# Patient Record
Sex: Female | Born: 1955 | Race: White | Hispanic: No | Marital: Married | Smoking: Never smoker
Health system: Southern US, Community
[De-identification: ages and names within clinical notes are randomized; demographics above are authoritative.]

## PROBLEM LIST (undated history)

## (undated) DIAGNOSIS — Z972 Presence of dental prosthetic device (complete) (partial): Secondary | ICD-10-CM

## (undated) DIAGNOSIS — C801 Malignant (primary) neoplasm, unspecified: Secondary | ICD-10-CM

## (undated) DIAGNOSIS — E669 Obesity, unspecified: Secondary | ICD-10-CM

## (undated) DIAGNOSIS — Z853 Personal history of malignant neoplasm of breast: Secondary | ICD-10-CM

## (undated) DIAGNOSIS — R920 Mammographic microcalcification found on diagnostic imaging of breast: Secondary | ICD-10-CM

## (undated) DIAGNOSIS — Z8614 Personal history of Methicillin resistant Staphylococcus aureus infection: Secondary | ICD-10-CM

## (undated) DIAGNOSIS — H269 Unspecified cataract: Secondary | ICD-10-CM

## (undated) DIAGNOSIS — M5416 Radiculopathy, lumbar region: Secondary | ICD-10-CM

## (undated) DIAGNOSIS — Z98811 Dental restoration status: Secondary | ICD-10-CM

## (undated) DIAGNOSIS — M1990 Unspecified osteoarthritis, unspecified site: Secondary | ICD-10-CM

## (undated) HISTORY — PX: LYMPH NODE DISSECTION: SHX5087

## (undated) HISTORY — PX: OTHER SURGICAL HISTORY: SHX169

## (undated) HISTORY — DX: Radiculopathy, lumbar region: M54.16

## (undated) HISTORY — PX: DILATION AND CURETTAGE OF UTERUS: SHX78

## (undated) HISTORY — PX: TONSILLECTOMY: SUR1361

## (undated) HISTORY — DX: Personal history of Methicillin resistant Staphylococcus aureus infection: Z86.14

## (undated) HISTORY — PX: BREAST LUMPECTOMY: SHX2

## (undated) HISTORY — DX: Unspecified osteoarthritis, unspecified site: M19.90

## (undated) HISTORY — DX: Obesity, unspecified: E66.9

## (undated) HISTORY — PX: COLONOSCOPY: SHX174

## (undated) HISTORY — PX: KNEE ARTHROSCOPY: SHX127

## (undated) HISTORY — PX: TUBAL LIGATION: SHX77

## (undated) HISTORY — DX: Malignant (primary) neoplasm, unspecified: C80.1

---

## 1999-02-13 ENCOUNTER — Ambulatory Visit (HOSPITAL_BASED_OUTPATIENT_CLINIC_OR_DEPARTMENT_OTHER): Admission: RE | Admit: 1999-02-13 | Discharge: 1999-02-13 | Payer: Self-pay | Admitting: Orthopedic Surgery

## 1999-02-20 ENCOUNTER — Ambulatory Visit (HOSPITAL_BASED_OUTPATIENT_CLINIC_OR_DEPARTMENT_OTHER): Admission: RE | Admit: 1999-02-20 | Discharge: 1999-02-20 | Payer: Self-pay | Admitting: Orthopedic Surgery

## 1999-10-03 ENCOUNTER — Other Ambulatory Visit: Admission: RE | Admit: 1999-10-03 | Discharge: 1999-10-03 | Payer: Self-pay | Admitting: Obstetrics & Gynecology

## 2000-10-22 DIAGNOSIS — Z9889 Other specified postprocedural states: Secondary | ICD-10-CM

## 2000-10-22 HISTORY — DX: Other specified postprocedural states: Z98.890

## 2000-12-04 ENCOUNTER — Other Ambulatory Visit: Admission: RE | Admit: 2000-12-04 | Discharge: 2000-12-04 | Payer: Self-pay | Admitting: Obstetrics & Gynecology

## 2001-04-09 ENCOUNTER — Encounter (INDEPENDENT_AMBULATORY_CARE_PROVIDER_SITE_OTHER): Payer: Self-pay | Admitting: *Deleted

## 2001-04-09 ENCOUNTER — Encounter: Admission: RE | Admit: 2001-04-09 | Discharge: 2001-04-09 | Payer: Self-pay | Admitting: General Surgery

## 2001-04-09 ENCOUNTER — Other Ambulatory Visit: Admission: RE | Admit: 2001-04-09 | Discharge: 2001-04-09 | Payer: Self-pay | Admitting: General Surgery

## 2001-04-09 ENCOUNTER — Encounter: Payer: Self-pay | Admitting: General Surgery

## 2001-04-28 ENCOUNTER — Encounter (INDEPENDENT_AMBULATORY_CARE_PROVIDER_SITE_OTHER): Payer: Self-pay | Admitting: *Deleted

## 2001-04-28 ENCOUNTER — Encounter: Admission: RE | Admit: 2001-04-28 | Discharge: 2001-04-28 | Payer: Self-pay | Admitting: General Surgery

## 2001-04-28 ENCOUNTER — Encounter: Payer: Self-pay | Admitting: General Surgery

## 2001-04-28 ENCOUNTER — Ambulatory Visit (HOSPITAL_BASED_OUTPATIENT_CLINIC_OR_DEPARTMENT_OTHER): Admission: RE | Admit: 2001-04-28 | Discharge: 2001-04-28 | Payer: Self-pay | Admitting: General Surgery

## 2001-05-06 ENCOUNTER — Ambulatory Visit: Admission: RE | Admit: 2001-05-06 | Discharge: 2001-08-04 | Payer: Self-pay | Admitting: *Deleted

## 2001-08-05 ENCOUNTER — Ambulatory Visit: Admission: RE | Admit: 2001-08-05 | Discharge: 2001-11-03 | Payer: Self-pay | Admitting: *Deleted

## 2001-11-26 ENCOUNTER — Encounter: Admission: RE | Admit: 2001-11-26 | Discharge: 2001-11-26 | Payer: Self-pay | Admitting: General Surgery

## 2001-11-26 ENCOUNTER — Encounter: Payer: Self-pay | Admitting: General Surgery

## 2001-12-23 ENCOUNTER — Other Ambulatory Visit: Admission: RE | Admit: 2001-12-23 | Discharge: 2001-12-23 | Payer: Self-pay | Admitting: Obstetrics & Gynecology

## 2002-11-24 ENCOUNTER — Encounter: Payer: Self-pay | Admitting: General Surgery

## 2002-11-24 ENCOUNTER — Encounter: Admission: RE | Admit: 2002-11-24 | Discharge: 2002-11-24 | Payer: Self-pay | Admitting: General Surgery

## 2003-02-17 ENCOUNTER — Other Ambulatory Visit: Admission: RE | Admit: 2003-02-17 | Discharge: 2003-02-17 | Payer: Self-pay | Admitting: Obstetrics & Gynecology

## 2003-08-04 ENCOUNTER — Encounter (HOSPITAL_COMMUNITY): Admission: RE | Admit: 2003-08-04 | Discharge: 2003-11-02 | Payer: Self-pay | Admitting: General Surgery

## 2003-08-04 ENCOUNTER — Encounter: Payer: Self-pay | Admitting: General Surgery

## 2003-08-05 ENCOUNTER — Encounter: Payer: Self-pay | Admitting: General Surgery

## 2004-01-19 ENCOUNTER — Encounter: Admission: RE | Admit: 2004-01-19 | Discharge: 2004-01-19 | Payer: Self-pay | Admitting: Obstetrics & Gynecology

## 2004-01-24 ENCOUNTER — Encounter (INDEPENDENT_AMBULATORY_CARE_PROVIDER_SITE_OTHER): Payer: Self-pay | Admitting: *Deleted

## 2004-01-24 ENCOUNTER — Ambulatory Visit (HOSPITAL_COMMUNITY): Admission: RE | Admit: 2004-01-24 | Discharge: 2004-01-24 | Payer: Self-pay | Admitting: Orthopedic Surgery

## 2004-01-24 ENCOUNTER — Ambulatory Visit (HOSPITAL_BASED_OUTPATIENT_CLINIC_OR_DEPARTMENT_OTHER): Admission: RE | Admit: 2004-01-24 | Discharge: 2004-01-24 | Payer: Self-pay | Admitting: Orthopedic Surgery

## 2004-09-05 ENCOUNTER — Other Ambulatory Visit: Admission: RE | Admit: 2004-09-05 | Discharge: 2004-09-05 | Payer: Self-pay | Admitting: Obstetrics & Gynecology

## 2005-01-01 ENCOUNTER — Ambulatory Visit: Payer: Self-pay | Admitting: Oncology

## 2005-01-22 ENCOUNTER — Encounter: Admission: RE | Admit: 2005-01-22 | Discharge: 2005-01-22 | Payer: Self-pay | Admitting: Oncology

## 2005-02-22 ENCOUNTER — Ambulatory Visit: Payer: Self-pay | Admitting: Oncology

## 2005-05-01 ENCOUNTER — Ambulatory Visit: Payer: Self-pay | Admitting: Oncology

## 2005-08-27 ENCOUNTER — Other Ambulatory Visit: Admission: RE | Admit: 2005-08-27 | Discharge: 2005-08-27 | Payer: Self-pay | Admitting: Obstetrics & Gynecology

## 2006-02-01 ENCOUNTER — Encounter: Admission: RE | Admit: 2006-02-01 | Discharge: 2006-02-01 | Payer: Self-pay | Admitting: Obstetrics & Gynecology

## 2006-04-17 ENCOUNTER — Ambulatory Visit: Payer: Self-pay | Admitting: Oncology

## 2006-04-25 LAB — COMPREHENSIVE METABOLIC PANEL
AST: 13 U/L (ref 0–37)
Albumin: 4.4 g/dL (ref 3.5–5.2)
Alkaline Phosphatase: 43 U/L (ref 39–117)
BUN: 18 mg/dL (ref 6–23)
Potassium: 4.1 mEq/L (ref 3.5–5.3)
Sodium: 140 mEq/L (ref 135–145)

## 2006-04-25 LAB — CBC WITH DIFFERENTIAL/PLATELET
Eosinophils Absolute: 0.1 10*3/uL (ref 0.0–0.5)
MCH: 31.3 pg (ref 26.0–34.0)
MCHC: 33.4 g/dL (ref 32.0–36.0)
MCV: 93.7 fL (ref 81.0–101.0)
MONO#: 0.6 10*3/uL (ref 0.1–0.9)
MONO%: 8 % (ref 0.0–13.0)
NEUT%: 56.1 % (ref 39.6–76.8)
Platelets: 443 10*3/uL — ABNORMAL HIGH (ref 145–400)
RDW: 13.5 % (ref 11.3–14.5)
WBC: 7.3 10*3/uL (ref 3.9–10.0)

## 2006-04-25 LAB — MORPHOLOGY: RBC Comments: NORMAL

## 2007-02-10 ENCOUNTER — Encounter: Admission: RE | Admit: 2007-02-10 | Discharge: 2007-02-10 | Payer: Self-pay | Admitting: Oncology

## 2007-04-17 ENCOUNTER — Ambulatory Visit: Payer: Self-pay | Admitting: Oncology

## 2007-04-23 LAB — COMPREHENSIVE METABOLIC PANEL
AST: 13 U/L (ref 0–37)
Albumin: 4.1 g/dL (ref 3.5–5.2)
Alkaline Phosphatase: 50 U/L (ref 39–117)
BUN: 19 mg/dL (ref 6–23)
Glucose, Bld: 77 mg/dL (ref 70–99)
Potassium: 4.4 mEq/L (ref 3.5–5.3)
Sodium: 142 mEq/L (ref 135–145)
Total Bilirubin: 0.3 mg/dL (ref 0.3–1.2)
Total Protein: 6.9 g/dL (ref 6.0–8.3)

## 2007-04-23 LAB — CBC WITH DIFFERENTIAL/PLATELET
Basophils Absolute: 0 10*3/uL (ref 0.0–0.1)
Eosinophils Absolute: 0.2 10*3/uL (ref 0.0–0.5)
HCT: 39 % (ref 34.8–46.6)
LYMPH%: 34.1 % (ref 14.0–48.0)
MCV: 92.6 fL (ref 81.0–101.0)
MONO%: 10.4 % (ref 0.0–13.0)
NEUT#: 3.6 10*3/uL (ref 1.5–6.5)
NEUT%: 52.9 % (ref 39.6–76.8)
Platelets: 471 10*3/uL — ABNORMAL HIGH (ref 145–400)
RBC: 4.22 10*6/uL (ref 3.70–5.32)

## 2008-02-26 ENCOUNTER — Encounter: Admission: RE | Admit: 2008-02-26 | Discharge: 2008-02-26 | Payer: Self-pay | Admitting: Oncology

## 2008-04-21 ENCOUNTER — Ambulatory Visit: Payer: Self-pay | Admitting: Oncology

## 2008-04-26 LAB — CBC WITH DIFFERENTIAL/PLATELET
Basophils Absolute: 0 10*3/uL (ref 0.0–0.1)
EOS%: 2.4 % (ref 0.0–7.0)
HCT: 39.6 % (ref 34.8–46.6)
LYMPH%: 33.2 % (ref 14.0–48.0)
MCH: 30.9 pg (ref 26.0–34.0)
MCHC: 33.9 g/dL (ref 32.0–36.0)
MCV: 91 fL (ref 81.0–101.0)
MONO#: 0.6 10*3/uL (ref 0.1–0.9)
NEUT#: 3.2 10*3/uL (ref 1.5–6.5)
Platelets: 460 10*3/uL — ABNORMAL HIGH (ref 145–400)
RBC: 4.35 10*6/uL (ref 3.70–5.32)
RDW: 13 % (ref 11.3–14.5)
WBC: 5.9 10*3/uL (ref 3.9–10.0)

## 2008-04-26 LAB — COMPREHENSIVE METABOLIC PANEL
ALT: 11 U/L (ref 0–35)
Albumin: 4.3 g/dL (ref 3.5–5.2)
Alkaline Phosphatase: 51 U/L (ref 39–117)
BUN: 13 mg/dL (ref 6–23)
CO2: 24 mEq/L (ref 19–32)
Chloride: 105 mEq/L (ref 96–112)
Creatinine, Ser: 0.72 mg/dL (ref 0.40–1.20)
Glucose, Bld: 88 mg/dL (ref 70–99)
Total Bilirubin: 0.5 mg/dL (ref 0.3–1.2)

## 2008-04-26 LAB — LACTATE DEHYDROGENASE: LDH: 179 U/L (ref 94–250)

## 2008-04-26 LAB — MORPHOLOGY

## 2008-05-27 ENCOUNTER — Ambulatory Visit (HOSPITAL_COMMUNITY): Admission: RE | Admit: 2008-05-27 | Discharge: 2008-05-28 | Payer: Self-pay | Admitting: Orthopedic Surgery

## 2008-05-27 HISTORY — PX: CARPAL TUNNEL RELEASE: SHX101

## 2008-05-27 HISTORY — PX: SHOULDER ARTHROSCOPY: SHX128

## 2008-07-07 ENCOUNTER — Ambulatory Visit: Payer: Self-pay | Admitting: Oncology

## 2009-04-06 ENCOUNTER — Encounter: Admission: RE | Admit: 2009-04-06 | Discharge: 2009-04-06 | Payer: Self-pay | Admitting: Obstetrics & Gynecology

## 2009-04-20 ENCOUNTER — Ambulatory Visit: Payer: Self-pay | Admitting: Oncology

## 2009-05-06 LAB — CBC & DIFF AND RETIC
BASO%: 0.3 % (ref 0.0–2.0)
Basophils Absolute: 0 10*3/uL (ref 0.0–0.1)
Eosinophils Absolute: 0.2 10*3/uL (ref 0.0–0.5)
HGB: 13.5 g/dL (ref 11.6–15.9)
Immature Retic Fract: 3 % (ref 0.00–10.70)
LYMPH%: 34.6 % (ref 14.0–49.7)
MCH: 30.8 pg (ref 25.1–34.0)
MCHC: 34.3 g/dL (ref 31.5–36.0)
MCV: 89.7 fL (ref 79.5–101.0)
NEUT%: 52.9 % (ref 38.4–76.8)
RBC: 4.39 10*6/uL (ref 3.70–5.45)
RDW: 13.1 % (ref 11.2–14.5)
Retic %: 0.67 % (ref 0.50–1.50)
WBC: 6.6 10*3/uL (ref 3.9–10.3)
lymph#: 2.3 10*3/uL (ref 0.9–3.3)
nRBC: 0 % (ref 0–0)

## 2009-05-06 LAB — COMPREHENSIVE METABOLIC PANEL
Albumin: 4.1 g/dL (ref 3.5–5.2)
Calcium: 9.3 mg/dL (ref 8.4–10.5)
Chloride: 107 mEq/L (ref 96–112)
Potassium: 4.4 mEq/L (ref 3.5–5.3)
Sodium: 142 mEq/L (ref 135–145)
Total Protein: 6.7 g/dL (ref 6.0–8.3)

## 2009-05-06 LAB — IRON AND TIBC: Iron: 93 ug/dL (ref 42–145)

## 2009-05-06 LAB — FERRITIN: Ferritin: 51 ng/mL (ref 10–291)

## 2009-05-06 LAB — LACTATE DEHYDROGENASE: LDH: 166 U/L (ref 94–250)

## 2010-04-17 ENCOUNTER — Encounter: Admission: RE | Admit: 2010-04-17 | Discharge: 2010-04-17 | Payer: Self-pay | Admitting: Obstetrics & Gynecology

## 2010-04-26 ENCOUNTER — Ambulatory Visit: Payer: Self-pay | Admitting: Oncology

## 2010-04-28 LAB — MORPHOLOGY: PLT EST: ADEQUATE

## 2010-04-28 LAB — CBC WITH DIFFERENTIAL/PLATELET
EOS%: 3.2 % (ref 0.0–7.0)
HCT: 38.5 % (ref 34.8–46.6)
LYMPH%: 37.2 % (ref 14.0–49.7)
MCH: 30.2 pg (ref 25.1–34.0)
MCHC: 33 g/dL (ref 31.5–36.0)
MONO%: 8 % (ref 0.0–14.0)
NEUT%: 51 % (ref 38.4–76.8)
Platelets: 380 10*3/uL (ref 145–400)
RDW: 13.5 % (ref 11.2–14.5)
WBC: 6.9 10*3/uL (ref 3.9–10.3)
lymph#: 2.6 10*3/uL (ref 0.9–3.3)
nRBC: 0 % (ref 0–0)

## 2010-04-28 LAB — COMPREHENSIVE METABOLIC PANEL
AST: 14 U/L (ref 0–37)
Albumin: 3.9 g/dL (ref 3.5–5.2)
Alkaline Phosphatase: 53 U/L (ref 39–117)
Calcium: 9 mg/dL (ref 8.4–10.5)
Creatinine, Ser: 0.88 mg/dL (ref 0.40–1.20)
Sodium: 142 mEq/L (ref 135–145)
Total Protein: 6.7 g/dL (ref 6.0–8.3)

## 2010-04-28 LAB — LACTATE DEHYDROGENASE: LDH: 166 U/L (ref 94–250)

## 2010-10-27 ENCOUNTER — Encounter (INDEPENDENT_AMBULATORY_CARE_PROVIDER_SITE_OTHER): Payer: Self-pay | Admitting: *Deleted

## 2010-11-11 ENCOUNTER — Encounter: Payer: Self-pay | Admitting: Oncology

## 2010-11-11 ENCOUNTER — Other Ambulatory Visit: Payer: Self-pay | Admitting: Oncology

## 2010-11-11 DIAGNOSIS — Z1239 Encounter for other screening for malignant neoplasm of breast: Secondary | ICD-10-CM

## 2010-11-12 ENCOUNTER — Encounter: Payer: Self-pay | Admitting: General Surgery

## 2010-11-12 ENCOUNTER — Encounter: Payer: Self-pay | Admitting: Oncology

## 2010-11-13 ENCOUNTER — Encounter: Payer: Self-pay | Admitting: Oncology

## 2010-11-23 NOTE — Letter (Signed)
Summary: Pre Visit Letter Revised  [facility]  [address] [place], [place] [address]   Phone: [phone]  Fax: [phone]        10/27/2010 MRN: [id number] [facility] [address] [place], [place]  [address]             Procedure Date:  12/12/2010 @ 10:30   Recall Colon -Dr. Nestor Ramp   Welcome to the Gastroenterology Division at [facility].    You are scheduled to see a nurse for your pre-procedure visit on 11/28/2010 at 3:30 on the 3rd floor at [facility], [address]  We ask that you try to arrive at our office 15 minutes prior to your appointment time to allow for check-in.  Please take a minute to review the attached form.  If you answer "Yes" to one or more of the questions on the first page, we ask that you call the person listed at your earliest opportunity.  If you answer "No" to all of the questions, please complete the rest of the form and bring it to your appointment.    Your nurse visit will consist of discussing your medical and surgical history, your immediate family medical history, and your medications.   If you are unable to list all of your medications on the form, please bring the medication bottles to your appointment and we will list them.  We will need to be aware of both prescribed and over the counter drugs.  We will need to know exact dosage information as well.    Please be prepared to read and sign documents such as consent forms, a financial agreement, and acknowledgement forms.  If necessary, and with your consent, a friend or relative is welcome to sit-in on the nurse visit with you.  Please bring your insurance card so that we may make a copy of it.  If your insurance requires a referral to see a specialist, please bring your referral form from your primary care physician.  No co-pay is required for this nurse visit.     If you cannot keep your appointment, please call [phone] to cancel or reschedule prior to your  appointment date.  This allows [place] the opportunity to schedule an appointment for another patient in need of care.    Thank you for choosing [facility] for your medical needs.  We appreciate the opportunity to care for you.  Please visit [place] at our website  to learn more about our practice.  Sincerely, The Gastroenterology Division

## 2010-11-28 ENCOUNTER — Encounter (INDEPENDENT_AMBULATORY_CARE_PROVIDER_SITE_OTHER): Payer: Self-pay

## 2010-12-13 NOTE — Letter (Signed)
Summary: Pre Visit No Show Letter  [facility]  [address] [place], [place] [address]   Phone: [phone]  Fax: [phone]        November 28, 2010 MRN: [id number]    [facility] [address] [place], [place]  [address]    Dear [name],   We have been unable to reach you by phone concerning the pre-procedure visit that you missed on 11/28/10. For this reason,your procedure scheduled on 12/14/10 has been cancelled. Our scheduling staff will gladly assist you with rescheduling your appointments at a more convenient time. Please call our office at [phone] between the hours of 8:00am and 5:00pm, press option #2 to reach an appointment scheduler. Please consider updating your contact numbers at this time so that we can reach you by phone in the future with schedule changes or results.    Thank you,    Ulis Rias RN [facility]

## 2010-12-14 ENCOUNTER — Other Ambulatory Visit: Payer: Self-pay | Admitting: Internal Medicine

## 2011-03-06 NOTE — Op Note (Signed)
Alyssa [name], Alyssa [name]                 ACCOUNT NO.:  [id number]   MEDICAL RECORD NO.:  [id number]          PATIENT TYPE:  AMB   LOCATION:  DAY                          FACILITY:  [facility]   PHYSICIAN:  Marlowe Kays, M.D.  DATE OF BIRTH:  03/17/1956   DATE OF PROCEDURE:  05/27/2008  DATE OF DISCHARGE:                               OPERATIVE REPORT   PREOPERATIVE DIAGNOSES:  1. 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  2. Carpal tunnel syndrome right upper extremity.   POSTOPERATIVE DIAGNOSES:  1. 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  2. Carpal tunnel syndrome right upper extremity.   OPERATION:  1. Right shoulder arthroscopy with normal glenohumeral examination.  2. Arthroscopic subacromial and distal clavicle decompression.  3. Decompression right wrist and hand.   SURGEON:  Marlowe Kays, M.D.   ASSISTANTDruscilla Brownie. Idolina Primer, P.A.-C., operations #1 and 2.   ANESTHESIA:  General.   PATHOLOGY/JUSTIFICATION FOR PROCEDURE:  She had an MRI demonstrating  rotator cuff tendinopathy with some osteoarthritis of the AC joint.  Clinically, she had no tenderness there.  She also had a mild carpal  tunnel syndrome confirmed with nerve conduction studies.  She elected to  have the carpal tunnel release at this time since she was already having  shoulder surgery and the natural history that the carpal tunnel syndrome  would probably progress with time.   PROCEDURE:  Satisfactory general anesthesia.  Placed on the [place] frame,  and right shoulder girdle was prepped with DuraPrep, draped in sterile  field.  The anatomy of the shoulder joint was marked out.  Time out  performed.  I infiltrated the subacromial space and placement of the  posterior and lateral portals with half percent Marcaine with  adrenaline.  Through a posterior soft spot portal, I atraumatically  entered the glenohumeral joint.  There was minimal fraying of the  labrum, and  basically, she had a normal glenohumeral examination.  Representative pictures were taken.  I then redirected the scope in the  subacromial area.  She had a good bit of bursitis which was pictured.  I  debrided out a good bit of the subacromial space with the 4.2 shaver and  followed this with the 90-degree ArthroCare vaporizer removing soft  tissue from the undersurface of the acromion.  I also found that she had  prominent spurring at the [facility] joint, and I debrided this as well.  I then  followed this with 4-mm oval bur and began burring down not only the [facility]  joint but the undersurface of the distal acromion, went back-and-forth  between these 3 instruments until we had a nice clean subacromial joint  with wide decompression.  Both the [facility] joint and the subacromial space  documented with pictures using the vaporizer with the arm to the side  and the arm abducted.  I then irrigated the shoulder thoroughly and  closed the 2 portals with 4-0 nylon.  We infiltrated with half percent  Marcaine with adrenaline as well as subacromial space.  Betadine,  Adaptic, dry sterile dressing were applied.  I then  applied a pneumatic  tourniquet to the right arm and esmarched out the arm nonsterilely with  a pressure of 250 mmHg.  The right arm was then prepped from mid forearm  to fingertips with DuraPrep, draped in sterile field.  A second time-out  was performed.  I marked out a curved incision along the base of thenar  eminence crossing obliquely over the flexor crease of the wrist into the  distal forearm.  Palmaris longus tendon was identified and retracted to  the radial side.  The median nerve was identified, and I then began  progressive release of skin and subcutaneous tissue and the fascia into  the distal palm.  The main area of compression was in the mid palm, but  there was compression distally.  Potential bleeders were coagulated with  bipolar cautery.  The wound was then irrigated well with  sterile saline,  and the skin and subcutaneous tissue only closed with interrupted 4-0  nylon mattress sutures.  Betadine, dry sterile dressing, volar plaster  splint were applied.  Tourniquet was released.  She was placed in a  shoulder immobilizer for her shoulder and taken to the PACU in  satisfactory condition with no known complications.           ______________________________  Marlowe Kays, M.D.     JA/MEDQ  D:  05/27/2008  T:  05/27/2008  Job:  [id number]

## 2011-04-20 ENCOUNTER — Ambulatory Visit
Admission: RE | Admit: 2011-04-20 | Discharge: 2011-04-20 | Disposition: A | Discharge status: Home / Self Care | Payer: Private Health Insurance - Indemnity | Source: Ambulatory Visit | Attending: Oncology | Admitting: Oncology

## 2011-04-20 DIAGNOSIS — Z1239 Encounter for other screening for malignant neoplasm of breast: Secondary | ICD-10-CM

## 2011-06-01 ENCOUNTER — Other Ambulatory Visit: Payer: Self-pay | Admitting: Oncology

## 2011-06-01 ENCOUNTER — Encounter: Payer: Private Health Insurance - Indemnity | Admitting: Oncology

## 2011-06-01 LAB — CBC WITH DIFFERENTIAL/PLATELET
BASO%: 0.6 % (ref 0.0–2.0)
EOS%: 1.7 % (ref 0.0–7.0)
HCT: 39.8 % (ref 34.8–46.6)
HGB: 13.4 g/dL (ref 11.6–15.9)
MCHC: 33.7 g/dL (ref 31.5–36.0)
MCV: 93.7 fL (ref 79.5–101.0)
MONO%: 8.5 % (ref 0.0–14.0)
NEUT%: 51.2 % (ref 38.4–76.8)
Platelets: 379 10*3/uL (ref 145–400)
RDW: 13.7 % (ref 11.2–14.5)
WBC: 6.4 10*3/uL (ref 3.9–10.3)
lymph#: 2.4 10*3/uL (ref 0.9–3.3)

## 2011-06-01 LAB — COMPREHENSIVE METABOLIC PANEL
Alkaline Phosphatase: 53 U/L (ref 39–117)
CO2: 26 mEq/L (ref 19–32)
Chloride: 107 mEq/L (ref 96–112)
Glucose, Bld: 79 mg/dL (ref 70–99)
Sodium: 141 mEq/L (ref 135–145)
Total Bilirubin: 0.3 mg/dL (ref 0.3–1.2)

## 2011-06-01 LAB — LACTATE DEHYDROGENASE: LDH: 180 U/L (ref 94–250)

## 2011-07-20 LAB — HEMOGLOBIN AND HEMATOCRIT, BLOOD
HCT: 40.1
Hemoglobin: 13.4

## 2011-10-23 DIAGNOSIS — K5790 Diverticulosis of intestine, part unspecified, without perforation or abscess without bleeding: Secondary | ICD-10-CM

## 2011-10-23 HISTORY — DX: Diverticulosis of intestine, part unspecified, without perforation or abscess without bleeding: K57.90

## 2011-11-22 ENCOUNTER — Ambulatory Visit
Admission: RE | Admit: 2011-11-22 | Discharge: 2011-11-22 | Disposition: A | Discharge status: Home / Self Care | Payer: Private Health Insurance - Indemnity | Source: Ambulatory Visit | Attending: Family Medicine | Admitting: Family Medicine

## 2011-11-22 ENCOUNTER — Other Ambulatory Visit: Payer: Self-pay | Admitting: Family Medicine

## 2011-11-22 DIAGNOSIS — R509 Fever, unspecified: Secondary | ICD-10-CM

## 2012-01-21 ENCOUNTER — Encounter: Payer: Self-pay | Admitting: Internal Medicine

## 2012-03-11 ENCOUNTER — Ambulatory Visit (AMBULATORY_SURGERY_CENTER): Payer: Private Health Insurance - Indemnity

## 2012-03-11 VITALS — Ht 65.0 in | Wt 194.9 lb

## 2012-03-11 DIAGNOSIS — K5790 Diverticulosis of intestine, part unspecified, without perforation or abscess without bleeding: Secondary | ICD-10-CM

## 2012-03-11 DIAGNOSIS — K5730 Diverticulosis of large intestine without perforation or abscess without bleeding: Secondary | ICD-10-CM

## 2012-03-11 DIAGNOSIS — Z1211 Encounter for screening for malignant neoplasm of colon: Secondary | ICD-10-CM

## 2012-03-11 MED ORDER — PEG-KCL-NACL-NASULF-NA ASC-C 100 G PO SOLR: 1.0000 | Freq: Once | ORAL | Status: AC

## 2012-03-25 ENCOUNTER — Ambulatory Visit: Payer: Private Health Insurance - Indemnity | Admitting: Internal Medicine

## 2012-04-28 ENCOUNTER — Telehealth: Payer: Self-pay | Admitting: Internal Medicine

## 2012-04-28 NOTE — Telephone Encounter (Signed)
Called pt to review prep instructions.  No answer.  Will try later

## 2012-04-28 NOTE — Telephone Encounter (Signed)
Pt came in office today and picked up updated prep instructions.

## 2012-05-01 ENCOUNTER — Ambulatory Visit (AMBULATORY_SURGERY_CENTER): Payer: Private Health Insurance - Indemnity | Admitting: Internal Medicine

## 2012-05-01 ENCOUNTER — Encounter: Payer: Self-pay | Admitting: Internal Medicine

## 2012-05-01 VITALS — BP 143/95 | HR 64 | Temp 98.2°F | Resp 16 | Ht 65.0 in | Wt 194.0 lb

## 2012-05-01 DIAGNOSIS — Z1211 Encounter for screening for malignant neoplasm of colon: Secondary | ICD-10-CM

## 2012-05-01 DIAGNOSIS — K5730 Diverticulosis of large intestine without perforation or abscess without bleeding: Secondary | ICD-10-CM

## 2012-05-01 HISTORY — PX: COLONOSCOPY WITH PROPOFOL: SHX5780

## 2012-05-01 MED ORDER — SODIUM CHLORIDE 0.9 % IV SOLN: 500.0000 mL | INTRAVENOUS | Status: DC

## 2012-05-01 NOTE — Op Note (Signed)
 [facility] [address] [place], [place]  [address]  COLONOSCOPY PROCEDURE REPORT  PATIENT:  [name], Alyssa  MR#:  [id number] BIRTHDATE:  1956/04/09, 55 yrs. old  GENDER:  female ENDOSCOPIST:  Wilhemina Bonito. Eda Keys, MD REF. BY:  Ancil Boozer, M.D. PROCEDURE DATE:  05/01/2012 PROCEDURE:  Average-risk screening colonoscopy G0121 ASA CLASS:  Class II INDICATIONS:  Routine risk Screening ; negative index exam 07-2001  MEDICATIONS:   MAC sedation, administered by CRNA, propofol (Diprivan) 350 mg IV  DESCRIPTION OF PROCEDURE:   After the risks benefits and alternatives of the procedure were thoroughly explained, informed consent was obtained.  Digital rectal exam was performed and revealed no abnormalities.   The LB CF-[id number] endoscope was introduced through the anus and advanced to the cecum, which was identified by both the appendix and ileocecal valve, without limitations.  The quality of the prep was good, using MoviPrep. The instrument was then slowly withdrawn as the colon was fully examined. <<PROCEDUREIMAGES>>  FINDINGS:  Severe diverticulosis was found in the left colon and ascending colon  Otherwise normal colonoscopy without  polyps, masses, vascular ectasias, or inflammatory changes.   Retroflexed views in the rectum revealed no abnormalities.    The time to cecum =  3:29  minutes. The scope was then withdrawn in 10:26 minutes from the cecum and the procedure completed.  COMPLICATIONS:  None  ENDOSCOPIC IMPRESSION: 1) Severe diverticulosis in the left colon and ascending colon 2) Otherwise normal colonoscopy  RECOMMENDATIONS: 1) Continue current colorectal screening recommendations for "routine risk" patients with a repeat colonoscopy in 10 years.  ______________________________ Wilhemina Bonito. Eda Keys, MD  CC:  Ancil Boozer MD;  The Patient  n. eSIGNED:   Wilhemina Bonito. Eda Keys at 05/01/2012 02:27 PM  Mammie Lorenzo, [phone]

## 2012-05-01 NOTE — Patient Instructions (Signed)

## 2012-05-01 NOTE — Progress Notes (Signed)
Patient did not experience any of the following events: a burn prior to discharge; a fall within the facility; wrong site/side/patient/procedure/implant event; or a hospital transfer or hospital admission upon discharge from the facility. (G8907) Patient did not have preoperative order for IV antibiotic SSI prophylaxis. (G8918)  

## 2012-05-02 ENCOUNTER — Telehealth: Payer: Self-pay | Admitting: *Deleted

## 2012-05-02 NOTE — Telephone Encounter (Signed)
Left message to call office if questions or concerns. 

## 2012-05-23 ENCOUNTER — Encounter: Payer: Private Health Insurance - Indemnity | Admitting: Gastroenterology

## 2014-07-14 ENCOUNTER — Other Ambulatory Visit: Payer: Self-pay | Admitting: Obstetrics & Gynecology

## 2014-07-15 LAB — CYTOLOGY - PAP

## 2014-07-16 ENCOUNTER — Other Ambulatory Visit: Payer: Self-pay | Admitting: Obstetrics & Gynecology

## 2014-07-16 DIAGNOSIS — R928 Other abnormal and inconclusive findings on diagnostic imaging of breast: Secondary | ICD-10-CM

## 2014-07-26 ENCOUNTER — Ambulatory Visit
Admission: RE | Admit: 2014-07-26 | Discharge: 2014-07-26 | Disposition: A | Discharge status: Home / Self Care | Payer: BC Managed Care – PPO | Source: Ambulatory Visit | Attending: Obstetrics & Gynecology | Admitting: Obstetrics & Gynecology

## 2014-07-26 ENCOUNTER — Encounter (INDEPENDENT_AMBULATORY_CARE_PROVIDER_SITE_OTHER): Payer: Self-pay

## 2014-07-26 DIAGNOSIS — R928 Other abnormal and inconclusive findings on diagnostic imaging of breast: Secondary | ICD-10-CM

## 2014-08-13 ENCOUNTER — Telehealth (INDEPENDENT_AMBULATORY_CARE_PROVIDER_SITE_OTHER): Payer: Self-pay

## 2014-08-13 NOTE — Telephone Encounter (Signed)
Pt made aware that her records and films have been sent to Dr. Andi Devon at [facility] for review.  Will contact the patient when we hear from them.

## 2014-08-31 ENCOUNTER — Telehealth (INDEPENDENT_AMBULATORY_CARE_PROVIDER_SITE_OTHER): Payer: Self-pay

## 2014-08-31 NOTE — Telephone Encounter (Signed)
Pt was seen for a nipple anomaly by Dr. Zella Richer.  Pt has cancelled an appt at [facility] for a second opinion, and would like to discuss what exactly would be involved with this surgery.  Pt would like to speak with Dr. Zella Richer.  Message also in [employer].

## 2014-09-03 ENCOUNTER — Encounter (INDEPENDENT_AMBULATORY_CARE_PROVIDER_SITE_OTHER): Payer: Self-pay | Admitting: General Surgery

## 2014-09-03 NOTE — Progress Notes (Signed)
Patient ID: Alyssa [name], female   DOB: 1956/04/10, 58 y.o.   MRN: [id number] I spoke with her today. She canceled her referral for a second opinion. She still undecided about what to do. She is also concerned about the financial burden may put on her and her family. I told her that I highly recommend she proceed with a second opinion as that may help her make her mind up. She stated she would think about it and speak with her husband about it. I asked her to get back with me with her plan of action.

## 2014-09-24 ENCOUNTER — Encounter (INDEPENDENT_AMBULATORY_CARE_PROVIDER_SITE_OTHER): Payer: Self-pay | Admitting: General Surgery

## 2014-09-24 NOTE — Progress Notes (Signed)
Patient ID: Alyssa [name], female   DOB: 1956/08/07, 58 y.o.   MRN: [id number] Dr. Barry Dienes reviewed her mammograms and suggested that a nipple punch biopsy could be attempted in lieu of nipple removal.  I explained this to [name].  She has decided to proceed with nipple removal and we will get this scheduled.

## 2014-10-22 DIAGNOSIS — R920 Mammographic microcalcification found on diagnostic imaging of breast: Secondary | ICD-10-CM

## 2014-10-22 HISTORY — DX: Mammographic microcalcification found on diagnostic imaging of breast: R92.0

## 2014-11-15 ENCOUNTER — Encounter (HOSPITAL_BASED_OUTPATIENT_CLINIC_OR_DEPARTMENT_OTHER): Payer: Self-pay | Admitting: *Deleted

## 2014-11-15 NOTE — Pre-Procedure Instructions (Signed)
To come for CBC, diff, CMET, PT(INR)

## 2014-11-16 ENCOUNTER — Encounter (HOSPITAL_BASED_OUTPATIENT_CLINIC_OR_DEPARTMENT_OTHER)
Admission: RE | Admit: 2014-11-16 | Discharge: 2014-11-16 | Disposition: A | Discharge status: Home / Self Care | Payer: 59 | Source: Ambulatory Visit | Attending: General Surgery | Admitting: General Surgery

## 2014-11-16 DIAGNOSIS — R920 Mammographic microcalcification found on diagnostic imaging of breast: Secondary | ICD-10-CM | POA: Diagnosis present

## 2014-11-16 DIAGNOSIS — Z7982 Long term (current) use of aspirin: Secondary | ICD-10-CM | POA: Diagnosis not present

## 2014-11-16 DIAGNOSIS — Z853 Personal history of malignant neoplasm of breast: Secondary | ICD-10-CM | POA: Diagnosis not present

## 2014-11-16 LAB — CBC WITH DIFFERENTIAL/PLATELET
BASOS ABS: 0 10*3/uL (ref 0.0–0.1)
Basophils Relative: 1 % (ref 0–1)
Eosinophils Absolute: 0 10*3/uL (ref 0.0–0.7)
Eosinophils Relative: 0 % (ref 0–5)
HEMATOCRIT: 41.4 % (ref 36.0–46.0)
Hemoglobin: 13.5 g/dL (ref 12.0–15.0)
LYMPHS ABS: 2.1 10*3/uL (ref 0.7–4.0)
Lymphocytes Relative: 39 % (ref 12–46)
MCH: 29.6 pg (ref 26.0–34.0)
MCHC: 32.6 g/dL (ref 30.0–36.0)
MCV: 90.8 fL (ref 78.0–100.0)
Monocytes Absolute: 0.5 10*3/uL (ref 0.1–1.0)
Monocytes Relative: 10 % (ref 3–12)
NEUTROS ABS: 2.7 10*3/uL (ref 1.7–7.7)
Neutrophils Relative %: 50 % (ref 43–77)
Platelets: 372 10*3/uL (ref 150–400)
RBC: 4.56 MIL/uL (ref 3.87–5.11)
RDW: 13.6 % (ref 11.5–15.5)
WBC: 5.3 10*3/uL (ref 4.0–10.5)

## 2014-11-16 LAB — COMPREHENSIVE METABOLIC PANEL
ALBUMIN: 3.8 g/dL (ref 3.5–5.2)
ALT: 18 U/L (ref 0–35)
ANION GAP: 8 (ref 5–15)
AST: 20 U/L (ref 0–37)
Alkaline Phosphatase: 58 U/L (ref 39–117)
BUN: 14 mg/dL (ref 6–23)
CHLORIDE: 108 mmol/L (ref 96–112)
CO2: 25 mmol/L (ref 19–32)
CREATININE: 0.83 mg/dL (ref 0.50–1.10)
Calcium: 9.1 mg/dL (ref 8.4–10.5)
GFR calc Af Amer: 88 mL/min — ABNORMAL LOW (ref >90)
GFR, EST NON AFRICAN AMERICAN: 76 mL/min — AB (ref >90)
GLUCOSE: 73 mg/dL (ref 70–99)
Potassium: 4.7 mmol/L (ref 3.5–5.1)
Sodium: 141 mmol/L (ref 135–145)
TOTAL PROTEIN: 7 g/dL (ref 6.0–8.3)
Total Bilirubin: 0.6 mg/dL (ref 0.3–1.2)

## 2014-11-16 LAB — PROTIME-INR
INR: 0.98 (ref 0.00–1.49)
PROTHROMBIN TIME: 13.1 s (ref 11.6–15.2)

## 2014-11-18 ENCOUNTER — Encounter (HOSPITAL_BASED_OUTPATIENT_CLINIC_OR_DEPARTMENT_OTHER)
Admission: RE | Disposition: A | Discharge status: Home / Self Care | Payer: Self-pay | Source: Ambulatory Visit | Attending: General Surgery

## 2014-11-18 ENCOUNTER — Encounter (HOSPITAL_BASED_OUTPATIENT_CLINIC_OR_DEPARTMENT_OTHER): Payer: Self-pay

## 2014-11-18 ENCOUNTER — Ambulatory Visit (HOSPITAL_BASED_OUTPATIENT_CLINIC_OR_DEPARTMENT_OTHER)
Admission: RE | Admit: 2014-11-18 | Discharge: 2014-11-18 | Disposition: A | Discharge status: Home / Self Care | Payer: 59 | Source: Ambulatory Visit | Attending: General Surgery | Admitting: General Surgery

## 2014-11-18 ENCOUNTER — Ambulatory Visit (HOSPITAL_BASED_OUTPATIENT_CLINIC_OR_DEPARTMENT_OTHER): Payer: 59 | Admitting: Anesthesiology

## 2014-11-18 DIAGNOSIS — R920 Mammographic microcalcification found on diagnostic imaging of breast: Secondary | ICD-10-CM | POA: Diagnosis not present

## 2014-11-18 DIAGNOSIS — Z7982 Long term (current) use of aspirin: Secondary | ICD-10-CM | POA: Insufficient documentation

## 2014-11-18 DIAGNOSIS — Z853 Personal history of malignant neoplasm of breast: Secondary | ICD-10-CM | POA: Insufficient documentation

## 2014-11-18 HISTORY — DX: Presence of dental prosthetic device (complete) (partial): Z97.2

## 2014-11-18 HISTORY — DX: Unspecified cataract: H26.9

## 2014-11-18 HISTORY — DX: Dental restoration status: Z98.811

## 2014-11-18 HISTORY — PX: BREAST BIOPSY: SHX20

## 2014-11-18 HISTORY — DX: Mammographic microcalcification found on diagnostic imaging of breast: R92.0

## 2014-11-18 HISTORY — DX: Personal history of malignant neoplasm of breast: Z85.3

## 2014-11-18 SURGERY — BREAST BIOPSY
Anesthesia: General | Site: Breast | Laterality: Left

## 2014-11-18 MED ORDER — MIDAZOLAM HCL 2 MG/ML PO SYRP: 12.0000 mg | ORAL_SOLUTION | Freq: Once | ORAL | Status: DC | PRN

## 2014-11-18 MED ORDER — DEXAMETHASONE SODIUM PHOSPHATE 4 MG/ML IJ SOLN
INTRAMUSCULAR | Status: DC | PRN
  Administered 2014-11-18: 10 mg via INTRAVENOUS

## 2014-11-18 MED ORDER — PROMETHAZINE HCL 25 MG/ML IJ SOLN: 6.2500 mg | INTRAMUSCULAR | Status: DC | PRN

## 2014-11-18 MED ORDER — HYDROMORPHONE HCL 1 MG/ML IJ SOLN: 0.2500 mg | INTRAMUSCULAR | Status: DC | PRN

## 2014-11-18 MED ORDER — MIDAZOLAM HCL 5 MG/5ML IJ SOLN
INTRAMUSCULAR | Status: DC | PRN
  Administered 2014-11-18: 2 mg via INTRAVENOUS

## 2014-11-18 MED ORDER — BUPIVACAINE HCL (PF) 0.5 % IJ SOLN
INTRAMUSCULAR | Status: AC
  Filled 2014-11-18: qty 30

## 2014-11-18 MED ORDER — OXYCODONE HCL 5 MG PO TABS: 5.0000 mg | ORAL_TABLET | Freq: Once | ORAL | Status: DC | PRN

## 2014-11-18 MED ORDER — CEFAZOLIN SODIUM-DEXTROSE 2-3 GM-% IV SOLR
INTRAVENOUS | Status: AC
  Filled 2014-11-18: qty 50

## 2014-11-18 MED ORDER — MIDAZOLAM HCL 2 MG/2ML IJ SOLN
INTRAMUSCULAR | Status: AC
  Filled 2014-11-18: qty 2

## 2014-11-18 MED ORDER — OXYCODONE HCL 5 MG/5ML PO SOLN: 5.0000 mg | Freq: Once | ORAL | Status: DC | PRN

## 2014-11-18 MED ORDER — FENTANYL CITRATE 0.05 MG/ML IJ SOLN: 50.0000 ug | INTRAMUSCULAR | Status: DC | PRN

## 2014-11-18 MED ORDER — HYDROCODONE-ACETAMINOPHEN 5-325 MG PO TABS: 1.0000 | ORAL_TABLET | ORAL | Status: DC | PRN

## 2014-11-18 MED ORDER — PROPOFOL 10 MG/ML IV BOLUS
INTRAVENOUS | Status: DC | PRN
  Administered 2014-11-18: 200 mg via INTRAVENOUS

## 2014-11-18 MED ORDER — CEFAZOLIN SODIUM-DEXTROSE 2-3 GM-% IV SOLR
2.0000 g | INTRAVENOUS | Status: AC
  Administered 2014-11-18: 2 g via INTRAVENOUS

## 2014-11-18 MED ORDER — BUPIVACAINE HCL (PF) 0.5 % IJ SOLN
INTRAMUSCULAR | Status: DC | PRN
  Administered 2014-11-18: 10 mL

## 2014-11-18 MED ORDER — LIDOCAINE HCL (CARDIAC) 20 MG/ML IV SOLN
INTRAVENOUS | Status: DC | PRN
  Administered 2014-11-18: 50 mg via INTRAVENOUS

## 2014-11-18 MED ORDER — LACTATED RINGERS IV SOLN
INTRAVENOUS | Status: DC
  Administered 2014-11-18 (×2): via INTRAVENOUS

## 2014-11-18 MED ORDER — FENTANYL CITRATE 0.05 MG/ML IJ SOLN
INTRAMUSCULAR | Status: DC | PRN
  Administered 2014-11-18 (×2): 50 ug via INTRAVENOUS

## 2014-11-18 MED ORDER — FENTANYL CITRATE 0.05 MG/ML IJ SOLN
INTRAMUSCULAR | Status: AC
  Filled 2014-11-18: qty 4

## 2014-11-18 MED ORDER — MIDAZOLAM HCL 2 MG/2ML IJ SOLN: 1.0000 mg | INTRAMUSCULAR | Status: DC | PRN

## 2014-11-18 MED ORDER — ONDANSETRON HCL 4 MG/2ML IJ SOLN
INTRAMUSCULAR | Status: DC | PRN
  Administered 2014-11-18: 4 mg via INTRAVENOUS

## 2014-11-18 SURGICAL SUPPLY — 47 items
APL SKNCLS STERI-STRIP NONHPOA (GAUZE/BANDAGES/DRESSINGS) ×1
BENZOIN TINCTURE PRP APPL 2/3 (GAUZE/BANDAGES/DRESSINGS) ×3 IMPLANT
BINDER BREAST LRG (GAUZE/BANDAGES/DRESSINGS) ×3 IMPLANT
BLADE SURG 15 STRL LF DISP TIS (BLADE) ×1 IMPLANT
BLADE SURG 15 STRL SS (BLADE) ×3
CANISTER SUCT 1200ML W/VALVE (MISCELLANEOUS) IMPLANT
CHLORAPREP W/TINT 26ML (MISCELLANEOUS) ×3 IMPLANT
CLOSURE WOUND 1/2 X4 (GAUZE/BANDAGES/DRESSINGS) ×1
COVER BACK TABLE 60X90IN (DRAPES) ×3 IMPLANT
COVER MAYO STAND STRL (DRAPES) ×3 IMPLANT
DECANTER SPIKE VIAL GLASS SM (MISCELLANEOUS) ×1 IMPLANT
DEVICE DUBIN W/COMP PLATE 8390 (MISCELLANEOUS) ×2 IMPLANT
DRAPE LAPAROTOMY 100X72 PEDS (DRAPES) ×3 IMPLANT
DRAPE UTILITY XL STRL (DRAPES) ×3 IMPLANT
DRSG TELFA 3X8 NADH (GAUZE/BANDAGES/DRESSINGS) ×3 IMPLANT
ELECT COATED BLADE 2.86 ST (ELECTRODE) ×3 IMPLANT
ELECT REM PT RETURN 9FT ADLT (ELECTROSURGICAL) ×3
ELECTRODE REM PT RTRN 9FT ADLT (ELECTROSURGICAL) ×1 IMPLANT
GAUZE SPONGE 4X4 12PLY STRL (GAUZE/BANDAGES/DRESSINGS) ×3 IMPLANT
GLOVE BIOGEL PI IND STRL 7.0 (GLOVE) ×2 IMPLANT
GLOVE BIOGEL PI IND STRL 8 (GLOVE) ×1 IMPLANT
GLOVE BIOGEL PI IND STRL 8.5 (GLOVE) ×1 IMPLANT
GLOVE BIOGEL PI INDICATOR 7.0 (GLOVE) ×4
GLOVE BIOGEL PI INDICATOR 8 (GLOVE) ×2
GLOVE BIOGEL PI INDICATOR 8.5 (GLOVE) ×2
GLOVE ECLIPSE 7.0 STRL STRAW (GLOVE) ×3 IMPLANT
GLOVE ECLIPSE 8.0 STRL XLNG CF (GLOVE) ×3 IMPLANT
GLOVE SURG SS PI 8.0 STRL IVOR (GLOVE) ×3 IMPLANT
GOWN STRL REUS W/ TWL LRG LVL3 (GOWN DISPOSABLE) ×1 IMPLANT
GOWN STRL REUS W/TWL LRG LVL3 (GOWN DISPOSABLE) ×3
NDL HYPO 25X1 1.5 SAFETY (NEEDLE) ×1 IMPLANT
NEEDLE HYPO 25X1 1.5 SAFETY (NEEDLE) ×3 IMPLANT
NS IRRIG 1000ML POUR BTL (IV SOLUTION) IMPLANT
PACK BASIN DAY SURGERY FS (CUSTOM PROCEDURE TRAY) ×3 IMPLANT
PENCIL BUTTON HOLSTER BLD 10FT (ELECTRODE) ×3 IMPLANT
SLEEVE SCD COMPRESS KNEE MED (MISCELLANEOUS) ×3 IMPLANT
SPONGE GAUZE 4X4 12PLY STER LF (GAUZE/BANDAGES/DRESSINGS) ×3 IMPLANT
STRIP CLOSURE SKIN 1/2X4 (GAUZE/BANDAGES/DRESSINGS) ×2 IMPLANT
SUT MON AB 4-0 PC3 18 (SUTURE) ×3 IMPLANT
SUT SILK 2 0 FS (SUTURE) ×3 IMPLANT
SUT VICRYL 3-0 CR8 SH (SUTURE) ×3 IMPLANT
SYR CONTROL 10ML LL (SYRINGE) ×3 IMPLANT
TOWEL OR 17X24 6PK STRL BLUE (TOWEL DISPOSABLE) ×3 IMPLANT
TOWEL OR NON WOVEN STRL DISP B (DISPOSABLE) ×3 IMPLANT
TUBE CONNECTING 20'X1/4 (TUBING)
TUBE CONNECTING 20X1/4 (TUBING) IMPLANT
YANKAUER SUCT BULB TIP NO VENT (SUCTIONS) IMPLANT

## 2014-11-18 NOTE — Op Note (Signed)
Operative Note  Alyssa [name] female 59 y.o. 11/18/2014  PREOPERATIVE DX:  Left nipple microcalcifications  POSTOPERATIVE DX:  Same  PROCEDURE:   Excision of left nipple and breast biopsy         Surgeon: Odis Hollingshead   Assistants: none  Anesthesia: General LMA anesthesia  Indications:   This is a 59 year old female with a history of right breast cancer. Mammogram this year demonstrated new microcalcifications in the center of the left nipple. She now presents for the above procedure.    Procedure Detail:  She was seen in the holding area. She is brought to the operating and placed supine on the operating table and general anesthetic was administered. A timeout was performed. Left breast was sterilely prepped and draped.  Using the scalpel, I excised the nipple as well as underlying breast tissue. Once this was done, a Faxitron was performed and the microcalcifications could be seen and contained in the specimen.  The wound was then inspected and bleeding was controlled with electrocautery. Half percent plain Marcaine was injected superficially and deep into the wound for local anesthetic effect.  The subcutaneous tissues approximated with interrupted 3-0 Vicryl sutures. The skin was closed with interrupted 4-0 Monocryl subcuticular stitches. Dermabond, sterile dressing, and a breast binder were applied.  She tolerated the procedure well without any apparent complications and was taken to the recovery room in satisfactory condition.   Estimated Blood Loss:  less than 100 mL         Specimens: left nipple and underlying breast tissue        Complications:  * No complications entered in OR log *         Disposition: PACU - hemodynamically stable.         Condition: stable

## 2014-11-18 NOTE — Anesthesia Postprocedure Evaluation (Signed)
Anesthesia Post Note  Patient: Alyssa [name]  Procedure(s) Performed: Procedure(s) (LRB): LEFT NIPPLE REMOVAL (Left)  Anesthesia type: general  Patient location: PACU  Post pain: Pain level controlled  Post assessment: Patient's Cardiovascular Status Stable  Last Vitals:  Filed Vitals:   11/18/14 0911  BP: 105/40  Pulse: 62  Temp: 36.4 C  Resp: 16    Post vital signs: Reviewed and stable  Level of consciousness: sedated  Complications: No apparent anesthesia complications

## 2014-11-18 NOTE — Anesthesia Preprocedure Evaluation (Signed)
Anesthesia Evaluation    Reviewed: Allergy & Precautions, H&P , NPO status , Patient's Chart, lab work & pertinent test results  Airway        Dental   Pulmonary neg pulmonary ROS,          Cardiovascular negative cardio ROS      Neuro/Psych negative neurological ROS  negative psych ROS   GI/Hepatic negative GI ROS, Neg liver ROS,   Endo/Other  negative endocrine ROS  Renal/GU negative Renal ROS     Musculoskeletal   Abdominal   Peds  Hematology   Anesthesia Other Findings   Reproductive/Obstetrics negative OB ROS                             Anesthesia Physical Anesthesia Plan  ASA: II  Anesthesia Plan: General LMA   Post-op Pain Management:    Induction:   Airway Management Planned:   Additional Equipment:   Intra-op Plan:   Post-operative Plan:   Informed Consent:   Plan Discussed with:   Anesthesia Plan Comments:         Anesthesia Quick Evaluation

## 2014-11-18 NOTE — Interval H&P Note (Signed)
History and Physical Interval Note:  11/18/2014 7:32 AM  Alyssa [name]  has presented today for surgery, with the diagnosis of left nipple microcalcifications, history of right breast cancer  The various methods of treatment have been discussed with the patient and family. After consideration of risks, benefits and other options for treatment, the patient has consented to  Procedure(s): LEFT NIPPLE REMOVAL (Left) as a surgical intervention .  The patient's history has been reviewed, patient examined, no change in status, stable for surgery.  I have reviewed the patient's chart and labs.  Questions were answered to the patient's satisfaction.     [name] Lenna Sciara

## 2014-11-18 NOTE — Transfer of Care (Signed)
Immediate Anesthesia Transfer of Care Note  Patient: Alyssa [name]  Procedure(s) Performed: Procedure(s): LEFT NIPPLE REMOVAL (Left)  Patient Location: PACU  Anesthesia Type:General  Level of Consciousness: sedated  Airway & Oxygen Therapy: Patient Spontanous Breathing and Patient connected to face mask oxygen  Post-op Assessment: Report given to PACU RN and Post -op Vital signs reviewed and stable  Post vital signs: Reviewed and stable  Last Vitals:  Filed Vitals:   11/18/14 0641  BP: 124/64  Pulse: 67  Temp: 36.7 C  Resp: 20    Complications: No apparent anesthesia complications

## 2014-11-18 NOTE — Anesthesia Procedure Notes (Signed)
Procedure Name: LMA Insertion Date/Time: 11/18/2014 7:40 AM Performed by: Lieutenant Diego Pre-anesthesia Checklist: Patient identified, Emergency Drugs available, Suction available and Patient being monitored Patient Re-evaluated:Patient Re-evaluated prior to inductionOxygen Delivery Method: Circle System Utilized Preoxygenation: Pre-oxygenation with 100% oxygen Intubation Type: IV induction Ventilation: Mask ventilation without difficulty LMA: LMA inserted LMA Size: 4.0 Number of attempts: 1 Airway Equipment and Method: Bite block Placement Confirmation: positive ETCO2 and breath sounds checked- equal and bilateral Tube secured with: Tape Dental Injury: Teeth and Oropharynx as per pre-operative assessment

## 2014-11-18 NOTE — Discharge Instructions (Addendum)
[place] Office Phone Number [phone]  BREAST BIOPSY/ PARTIAL MASTECTOMY: POST OP INSTRUCTIONS  Always review your discharge instruction sheet given to you by the facility where your surgery was performed.  IF YOU HAVE DISABILITY OR FAMILY LEAVE FORMS, YOU MUST BRING THEM TO THE OFFICE FOR PROCESSING.  DO NOT GIVE THEM TO YOUR DOCTOR.  1. A prescription for pain medication may be given to you upon discharge.  Take your pain medication as prescribed, if needed.  If narcotic pain medicine is not needed, then you may take acetaminophen (Tylenol) or ibuprofen (Advil) as needed. 2. Take your usually prescribed medications unless otherwise directed 3. If you need a refill on your pain medication, please contact your pharmacy.  They will contact our office to request authorization.  Prescriptions will not be filled after 5pm or on week-ends. 4. You should eat very light the first 24 hours after surgery, such as soup, crackers, pudding, etc.  Resume your normal diet the day after surgery. 5. Most patients will experience some swelling and bruising in the breast.  Ice packs and a good support bra will help.  Swelling and bruising can take several days to resolve.  6. It is common to experience some constipation if taking pain medication after surgery.  Increasing fluid intake and taking a stool softener will usually help or prevent this problem from occurring.  A mild laxative (Milk of Magnesia or Miralax) should be taken according to package directions if there are no bowel movements after 48 hours. 7. Unless discharge instructions indicate otherwise, you may remove your bandages 48 hours after surgery, and you may shower at that time.  You may have steri-strips (small skin tapes) in place directly over the incision.  These strips should be left on the skin for 7-10 days.  If your surgeon used skin glue on the incision, you may shower in 24 hours.  The glue will flake off over the next  2-3 weeks.  Any sutures or staples will be removed at the office during your follow-up visit. 8. ACTIVITIES:  You may resume light daily activities (gradually increasing) beginning the next day.  Wearing a good support bra or sports bra minimizes pain and swelling.  Resume normal activities when you are pain-free. a. You may drive when you no longer are taking prescription pain medication, you can comfortably wear a seatbelt, and you can safely maneuver your car and apply brakes. b. RETURN TO WORK:  ______________________________________________________________________________________ 9. You should see your doctor in the office for a follow-up appointment approximately two weeks after your surgery.  Please call and make this appointment if you do not already have one. 10.  11. OTHER INSTRUCTIONS: _______________________________________________________________________________________________ _____________________________________________________________________________________________________________________________________ _____________________________________________________________________________________________________________________________________ _____________________________________________________________________________________________________________________________________  WHEN TO CALL YOUR DOCTOR: 1. Fever over 101.0 2. Nausea and/or vomiting. 3. Extreme swelling or bruising. 4. Continued bleeding from incision. 5. Increased pain, redness, or drainage from the incision.  The clinic staff is available to answer your questions during regular business hours.  Please dont hesitate to call and ask to speak to one of the nurses for clinical concerns.  If you have a medical emergency, go to the nearest emergency room or call 911.  A surgeon from [facility] is always on call at the hospital.  For further questions, please visit [email or web address]     Post  Anesthesia Home Care Instructions  Activity: Get plenty of rest for the remainder of the day. A responsible adult should stay with you for 24 hours following  the procedure.  For the next 24 hours, DO NOT: -Drive a car -Paediatric nurse -Drink alcoholic beverages -Take any medication unless instructed by your physician -Make any legal decisions or sign important papers.  Meals: Start with liquid foods such as gelatin or soup. Progress to regular foods as tolerated. Avoid greasy, spicy, heavy foods. If nausea and/or vomiting occur, drink only clear liquids until the nausea and/or vomiting subsides. Call your physician if vomiting continues.  Special Instructions/Symptoms: Your throat may feel dry or sore from the anesthesia or the breathing tube placed in your throat during surgery. If this causes discomfort, gargle with warm salt water. The discomfort should disappear within 24 hours.

## 2014-11-18 NOTE — H&P (Signed)
Alyssa [name] is an 59 y.o. female.   Chief Complaint:   Here for elective surgery HPI:   59 year old female with a h/o right breast cancer who has developed left nipple calcifications on mammogram.  She now present for left nipple removal and breast biopsy  Past Medical History  Diagnosis Date  . Microcalcifications of the breast 10/2014    left  . History of breast cancer     right  . Cataract, immature     bilateral  . Dental bridge present     upper and lower  . Dental crowns present     6-7    Past Surgical History  Procedure Laterality Date  . Carpal tunnel release Right 05/27/2008  . Tubal ligation    . Tonsillectomy    . Dilation and curettage of uterus    . Breast lumpectomy Right   . Knee arthroscopy      x 3 - 2 on left, 1 on right  . Shoulder arthroscopy Right 05/27/2008  . Colonoscopy with propofol  05/01/2012    Family History  Problem Relation Age of Onset  . Heart disease Father    Social History:  reports that she has never smoked. She has never used smokeless tobacco. She reports that she drinks alcohol. She reports that she does not use illicit drugs.  Allergies: No Known Allergies  Medications Prior to Admission  Medication Sig Dispense Refill  . aspirin 81 MG tablet Take 81 mg by mouth daily.    . cholecalciferol (VITAMIN D) 400 UNITS TABS Take by mouth daily. Take 800 units daily    . Multiple Vitamin (MULTIVITAMIN) tablet Take 1 tablet by mouth daily. With Calcium      Results for orders placed or performed during the hospital encounter of 11/18/14 (from the past 48 hour(s))  CBC WITH DIFFERENTIAL     Status: None   Collection Time: 11/16/14  9:15 AM  Result Value Ref Range   WBC 5.3 4.0 - 10.5 K/uL   RBC 4.56 3.87 - 5.11 MIL/uL   Hemoglobin 13.5 12.0 - 15.0 g/dL   HCT 41.4 36.0 - 46.0 %   MCV 90.8 78.0 - 100.0 fL   MCH 29.6 26.0 - 34.0 pg   MCHC 32.6 30.0 - 36.0 g/dL   RDW 13.6 11.5 - 15.5 %   Platelets 372 150 - 400 K/uL   Neutrophils  Relative % 50 43 - 77 %   Neutro Abs 2.7 1.7 - 7.7 K/uL   Lymphocytes Relative 39 12 - 46 %   Lymphs Abs 2.1 0.7 - 4.0 K/uL   Monocytes Relative 10 3 - 12 %   Monocytes Absolute 0.5 0.1 - 1.0 K/uL   Eosinophils Relative 0 0 - 5 %   Eosinophils Absolute 0.0 0.0 - 0.7 K/uL   Basophils Relative 1 0 - 1 %   Basophils Absolute 0.0 0.0 - 0.1 K/uL  Comprehensive metabolic panel     Status: Abnormal   Collection Time: 11/16/14  9:15 AM  Result Value Ref Range   Sodium 141 135 - 145 mmol/L   Potassium 4.7 3.5 - 5.1 mmol/L   Chloride 108 96 - 112 mmol/L   CO2 25 19 - 32 mmol/L   Glucose, Bld 73 70 - 99 mg/dL   BUN 14 6 - 23 mg/dL   Creatinine, Ser 0.83 0.50 - 1.10 mg/dL   Calcium 9.1 8.4 - 10.5 mg/dL   Total Protein 7.0 6.0 - 8.3  g/dL   Albumin 3.8 3.5 - 5.2 g/dL   AST 20 0 - 37 U/L   ALT 18 0 - 35 U/L   Alkaline Phosphatase 58 39 - 117 U/L   Total Bilirubin 0.6 0.3 - 1.2 mg/dL   GFR calc non Af Amer 76 (L) >90 mL/min   GFR calc Af Amer 88 (L) >90 mL/min    Comment: (NOTE) The eGFR has been calculated using the CKD EPI equation. This calculation has not been validated in all clinical situations. eGFR's persistently <90 mL/min signify possible Chronic Kidney Disease.    Anion gap 8 5 - 15  Protime-INR     Status: None   Collection Time: 11/16/14  9:15 AM  Result Value Ref Range   Prothrombin Time 13.1 11.6 - 15.2 seconds   INR 0.98 0.00 - 1.49   No results found.  Review of Systems  Constitutional: Negative for fever and chills.  HENT: Negative for congestion and sore throat.   Gastrointestinal: Negative for nausea and vomiting.    Blood pressure 124/64, pulse 67, temperature 98 F (36.7 C), temperature source Oral, resp. rate 20, height $RemoveBe'5\' 5"'hrgALTguh$  (1.651 m), weight 188 lb (85.276 kg), SpO2 98 %. Physical Exam  Constitutional: She appears well-developed and well-nourished. No distress.  HENT:  Head: Normocephalic and atraumatic.  Cardiovascular: Normal rate and regular rhythm.    Respiratory: Effort normal and breath sounds normal.  No breast masses or nipple deformity  Neurological: She is alert.  Skin: Skin is warm.     Assessment/Plan Left nipple calcifications and h/o right breast cancer.  Plan:  Excision of left nipple and breast biopsy.  Procedure and risks have been discussed with her.  Tashana Haberl J 11/18/2014, 7:29 AM

## 2014-11-19 ENCOUNTER — Encounter (HOSPITAL_BASED_OUTPATIENT_CLINIC_OR_DEPARTMENT_OTHER): Payer: Self-pay | Admitting: General Surgery

## 2015-03-01 ENCOUNTER — Encounter: Payer: Self-pay | Admitting: Internal Medicine

## 2015-07-19 ENCOUNTER — Other Ambulatory Visit: Payer: Self-pay | Admitting: Obstetrics & Gynecology

## 2015-07-20 LAB — CYTOLOGY - PAP

## 2015-07-22 ENCOUNTER — Other Ambulatory Visit: Payer: Self-pay | Admitting: Obstetrics & Gynecology

## 2015-07-22 DIAGNOSIS — R928 Other abnormal and inconclusive findings on diagnostic imaging of breast: Secondary | ICD-10-CM

## 2015-08-01 ENCOUNTER — Other Ambulatory Visit: Payer: 59

## 2015-08-02 ENCOUNTER — Ambulatory Visit
Admission: RE | Admit: 2015-08-02 | Discharge: 2015-08-02 | Disposition: A | Discharge status: Home / Self Care | Payer: 59 | Source: Ambulatory Visit | Attending: Obstetrics & Gynecology | Admitting: Obstetrics & Gynecology

## 2015-08-02 DIAGNOSIS — R928 Other abnormal and inconclusive findings on diagnostic imaging of breast: Secondary | ICD-10-CM

## 2015-08-09 IMAGING — MG MM DIGITAL DIAGNOSTIC UNILAT*L*
2 series · 2 of 2 positions shown · non-contrast
Comparison: With priors.

CLINICAL DATA: History of right breast cancer status post
the left nipple. The patient denies nipple discharge.

EXAM:
DIGITAL DIAGNOSTIC  LEFT MAMMOGRAM

[L CC]
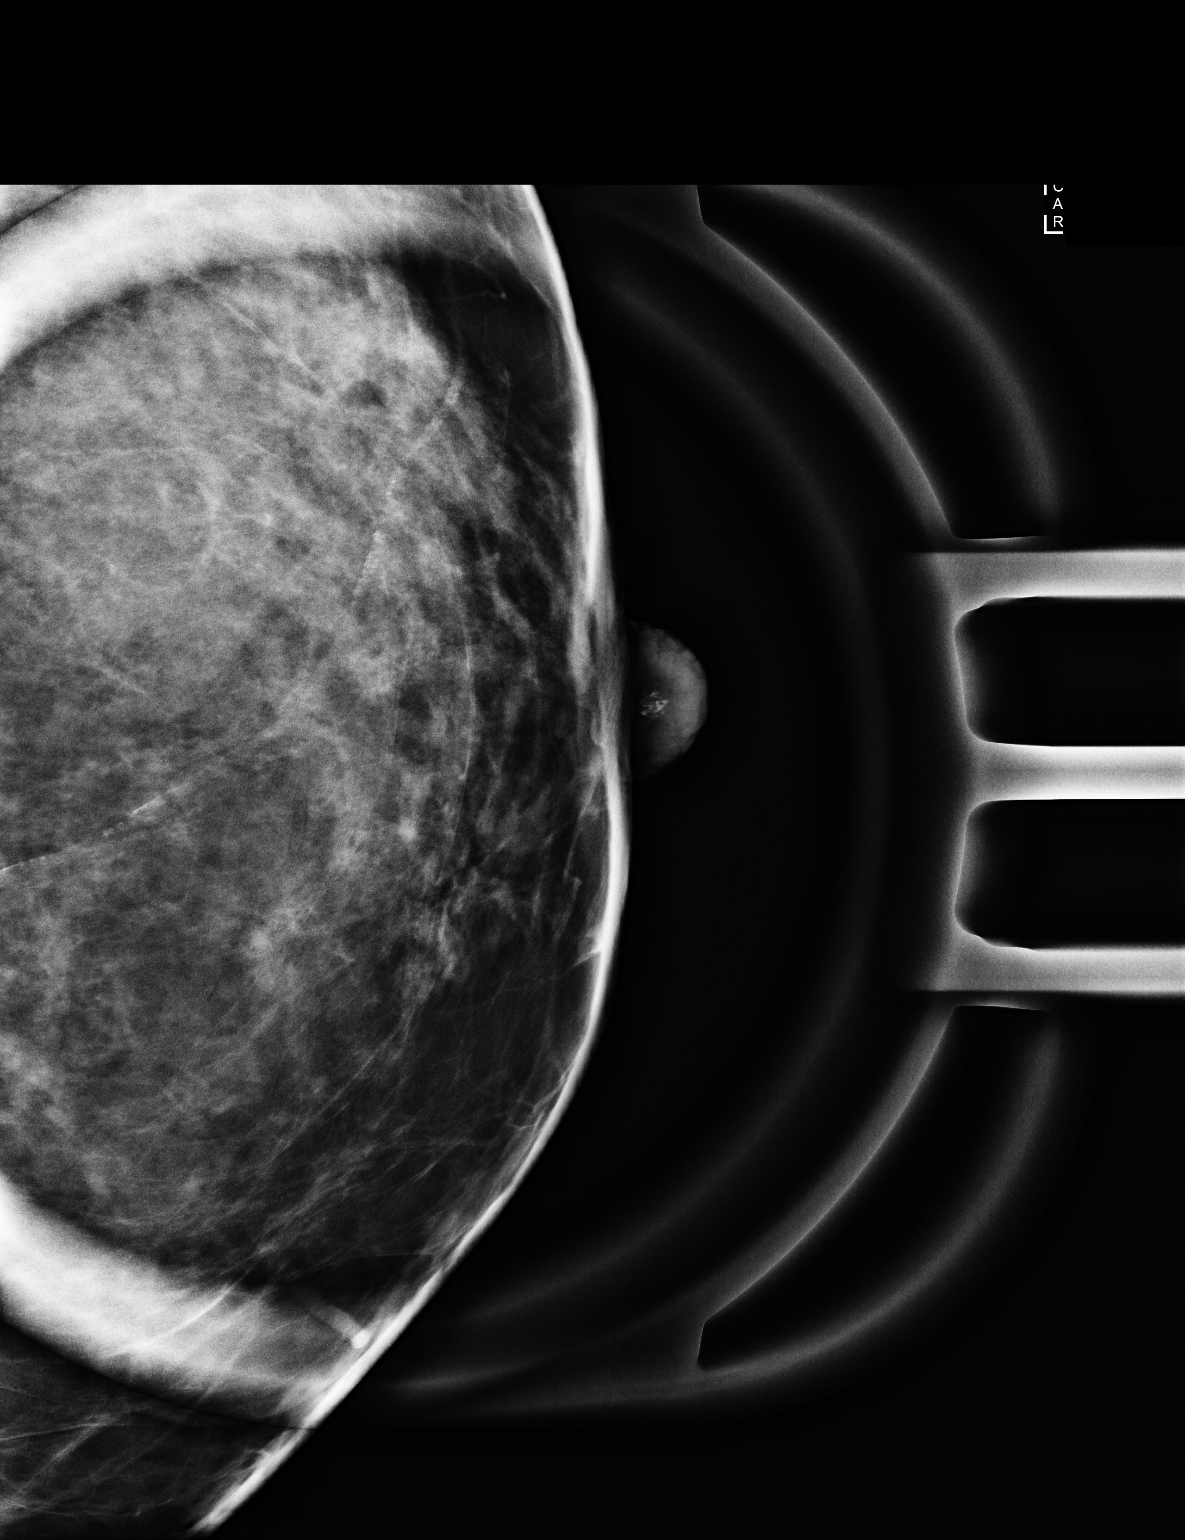

[L ML]
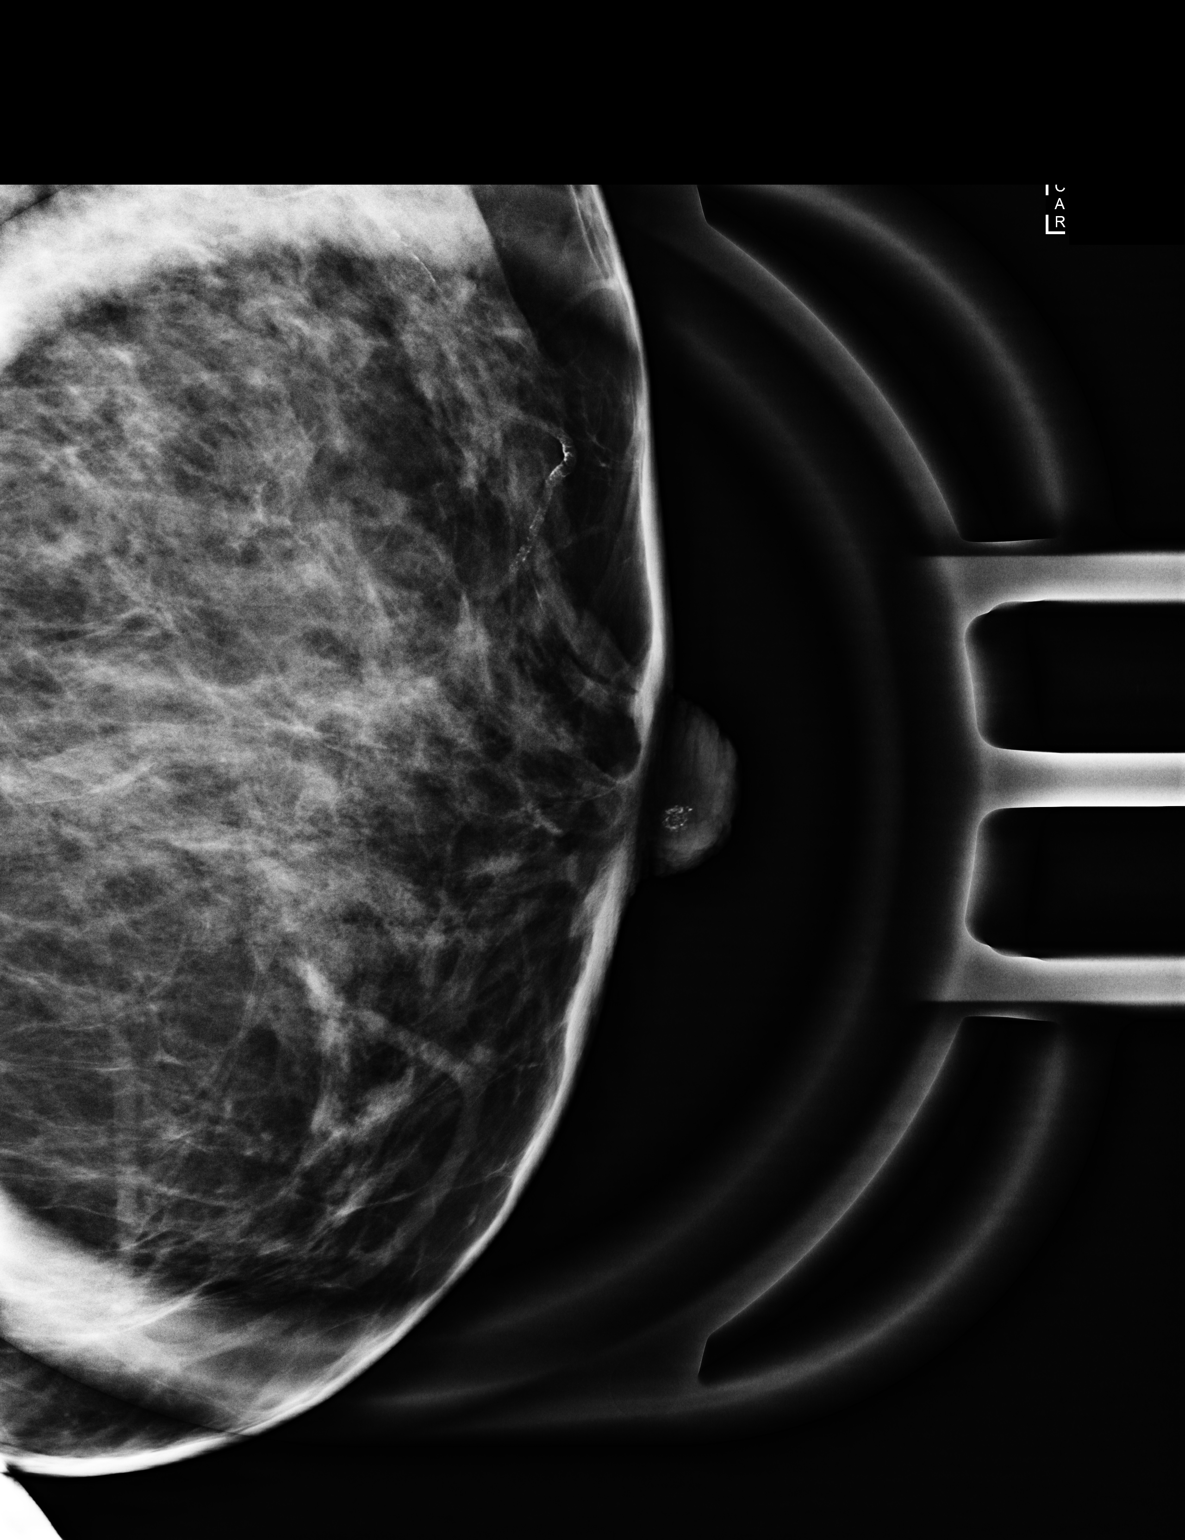

[2 of 2 positions shown; findings below may reference images not displayed]

ACR Breast Density Category b: There are scattered areas of
fibroglandular density.
FINDINGS: Magnification views of the left nipple were performed. There is
interval development of 3 mm coarse grouped calcifications.
IMPRESSION: Developing calcifications in the left nipple. This may represent a
papilloma.

RECOMMENDATION:
Surgical consultation for the left nipple calcifications is
recommended. The patient is scheduled to see Dr. Vucinaj on
[DATE] at 1 p.m.

I have discussed the findings and recommendations with the patient.
Results were also provided in writing at the conclusion of the
visit. If applicable, a reminder letter will be sent to the patient
regarding the next appointment.

BI-RADS CATEGORY  4: Suspicious.

## 2016-01-30 ENCOUNTER — Other Ambulatory Visit: Payer: Self-pay | Admitting: Neurosurgery

## 2016-01-30 DIAGNOSIS — M5416 Radiculopathy, lumbar region: Secondary | ICD-10-CM

## 2016-02-08 ENCOUNTER — Ambulatory Visit
Admission: RE | Admit: 2016-02-08 | Discharge: 2016-02-08 | Disposition: A | Discharge status: Home / Self Care | Payer: PRIVATE HEALTH INSURANCE | Source: Ambulatory Visit | Attending: Neurosurgery | Admitting: Neurosurgery

## 2016-02-08 DIAGNOSIS — M5416 Radiculopathy, lumbar region: Secondary | ICD-10-CM

## 2018-12-22 ENCOUNTER — Other Ambulatory Visit: Payer: Self-pay | Admitting: Family Medicine

## 2018-12-22 DIAGNOSIS — N644 Mastodynia: Secondary | ICD-10-CM

## 2019-01-13 ENCOUNTER — Other Ambulatory Visit: Payer: Self-pay

## 2019-01-13 ENCOUNTER — Ambulatory Visit
Admission: RE | Admit: 2019-01-13 | Discharge: 2019-01-13 | Disposition: A | Discharge status: Home / Self Care | Payer: PRIVATE HEALTH INSURANCE | Source: Ambulatory Visit | Attending: Family Medicine | Admitting: Family Medicine

## 2019-01-13 DIAGNOSIS — N644 Mastodynia: Secondary | ICD-10-CM

## 2020-01-12 ENCOUNTER — Other Ambulatory Visit: Payer: Self-pay | Admitting: Obstetrics & Gynecology

## 2020-01-12 DIAGNOSIS — N644 Mastodynia: Secondary | ICD-10-CM

## 2020-03-02 ENCOUNTER — Other Ambulatory Visit: Payer: PRIVATE HEALTH INSURANCE

## 2020-03-10 ENCOUNTER — Other Ambulatory Visit: Payer: PRIVATE HEALTH INSURANCE

## 2020-03-17 ENCOUNTER — Ambulatory Visit
Admission: RE | Admit: 2020-03-17 | Discharge: 2020-03-17 | Disposition: A | Discharge status: Home / Self Care | Payer: PRIVATE HEALTH INSURANCE | Source: Ambulatory Visit | Attending: Obstetrics & Gynecology | Admitting: Obstetrics & Gynecology

## 2020-03-17 ENCOUNTER — Other Ambulatory Visit: Payer: Self-pay

## 2020-03-17 ENCOUNTER — Other Ambulatory Visit: Payer: Self-pay | Admitting: Obstetrics & Gynecology

## 2020-03-17 ENCOUNTER — Ambulatory Visit: Payer: PRIVATE HEALTH INSURANCE

## 2020-03-17 DIAGNOSIS — Z1239 Encounter for other screening for malignant neoplasm of breast: Secondary | ICD-10-CM

## 2020-03-17 DIAGNOSIS — N644 Mastodynia: Secondary | ICD-10-CM

## 2021-01-31 ENCOUNTER — Other Ambulatory Visit: Payer: Self-pay | Admitting: Gastroenterology

## 2021-02-10 ENCOUNTER — Encounter: Payer: Self-pay | Admitting: *Deleted

## 2021-03-14 ENCOUNTER — Ambulatory Visit: Payer: PRIVATE HEALTH INSURANCE | Admitting: Cardiology

## 2021-03-31 IMAGING — MG DIGITAL SCREENING BILAT W/ TOMO W/ CAD
8 series · 9 of 24 positions shown · non-contrast
Comparison: Previous exam(s).

CLINICAL DATA: Screening. Prior RIGHT lumpectomy in 7667 and prior
LEFT nipple excision in 4243.

EXAM:
DIGITAL SCREENING BILATERAL MAMMOGRAM WITH TOMO AND CAD

[L CC synth-2D]
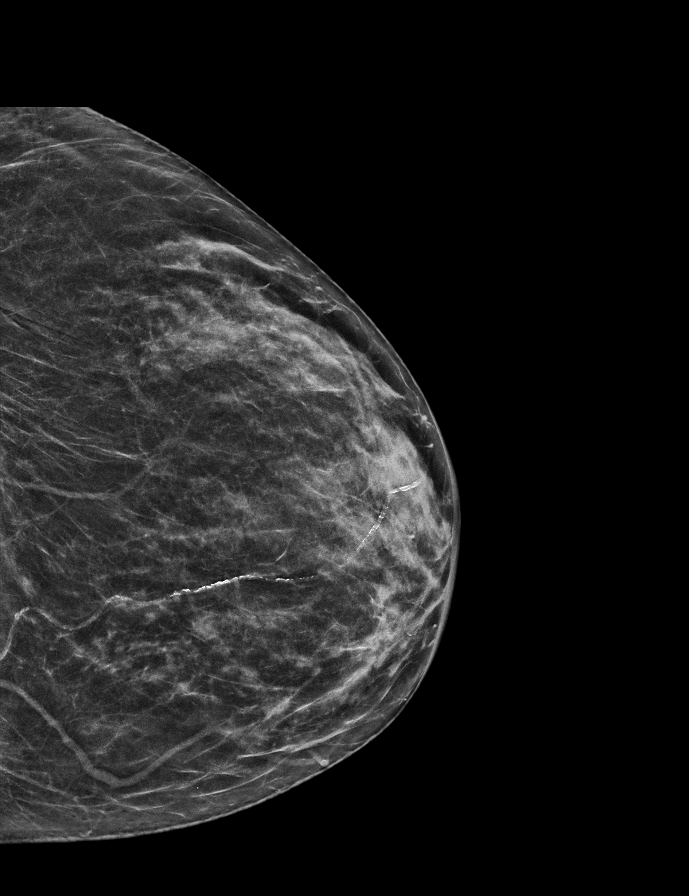

[L MLO synth-2D]
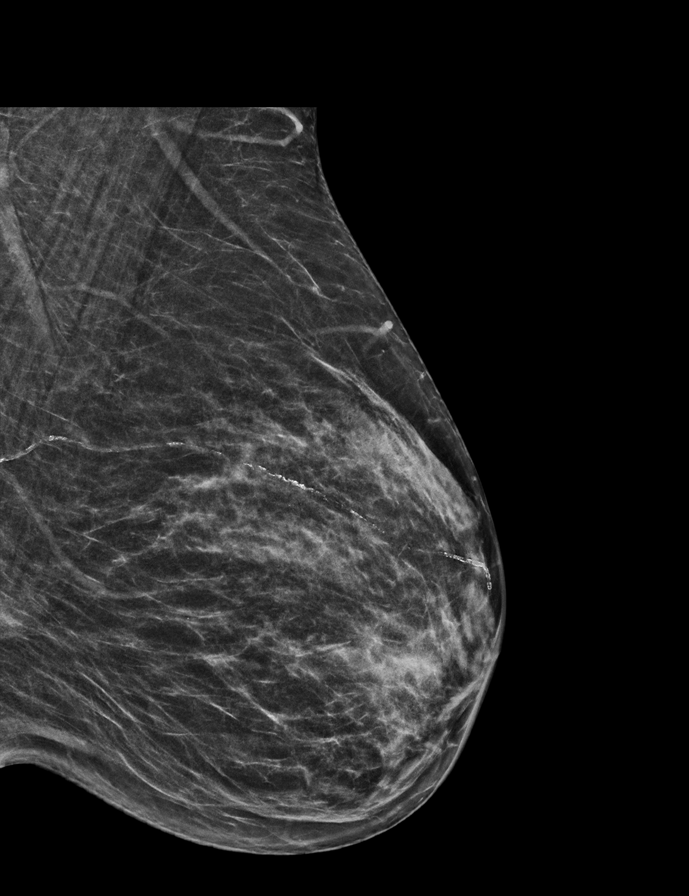

[R MLO synth-2D]
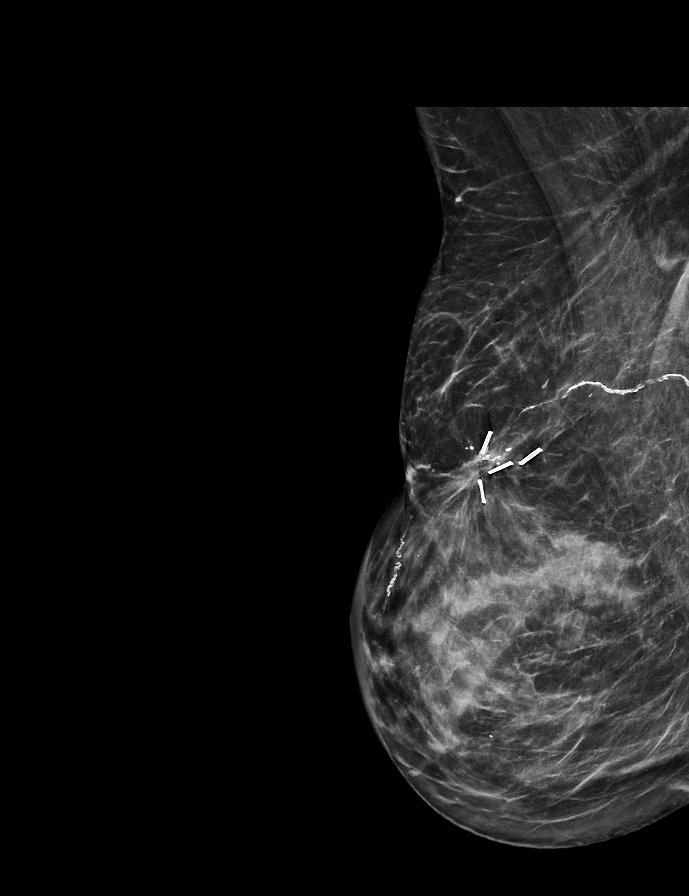

[R CC synth-2D]
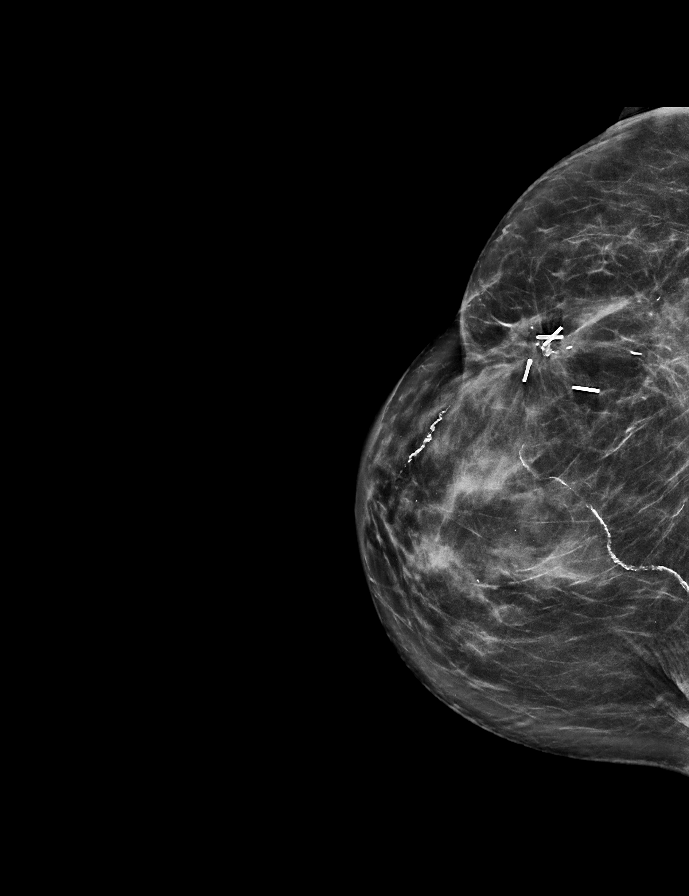

[R MLO tomo · 2 of 62 frames shown]
[frame 21/62]
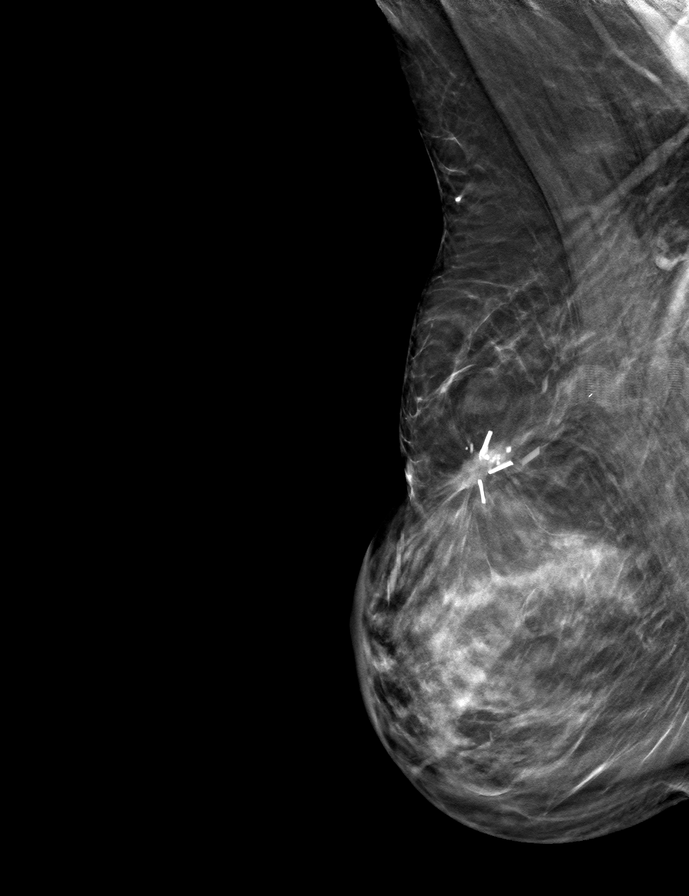
[frame 31/62]
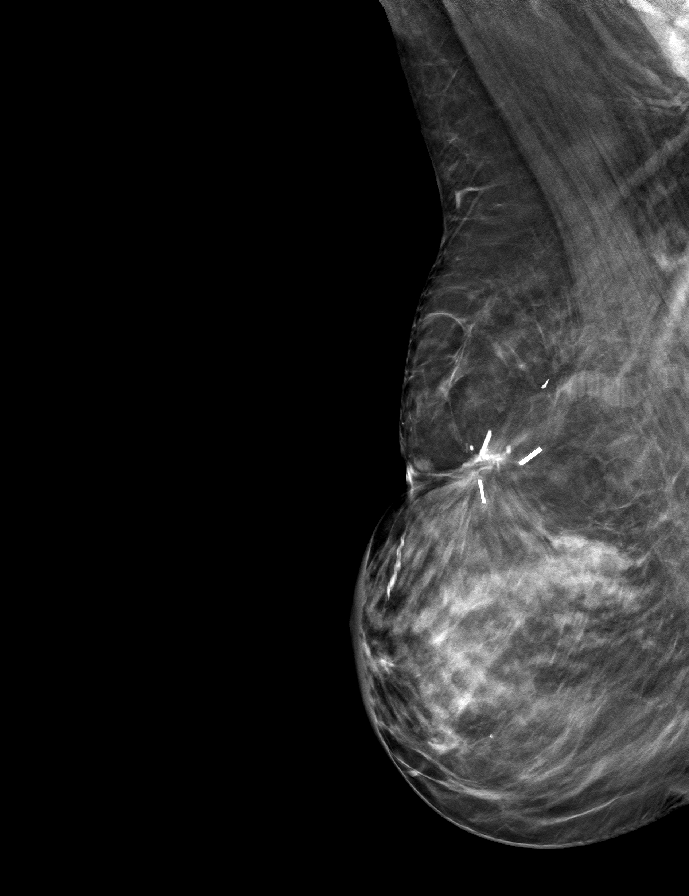

[R CC tomo · tomo slice 33/66.0]
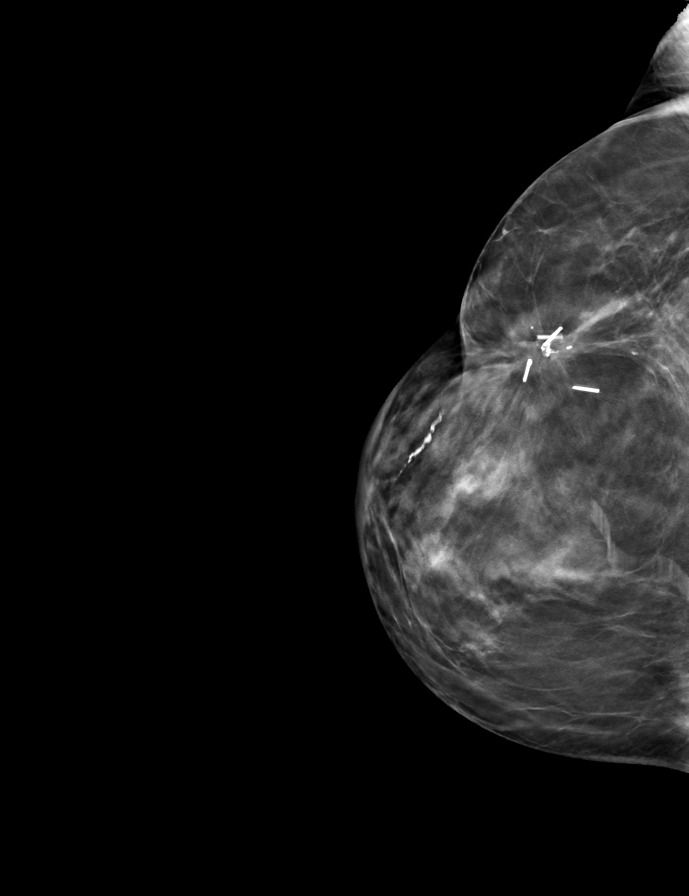

[L CC tomo · tomo slice 25/50.0]
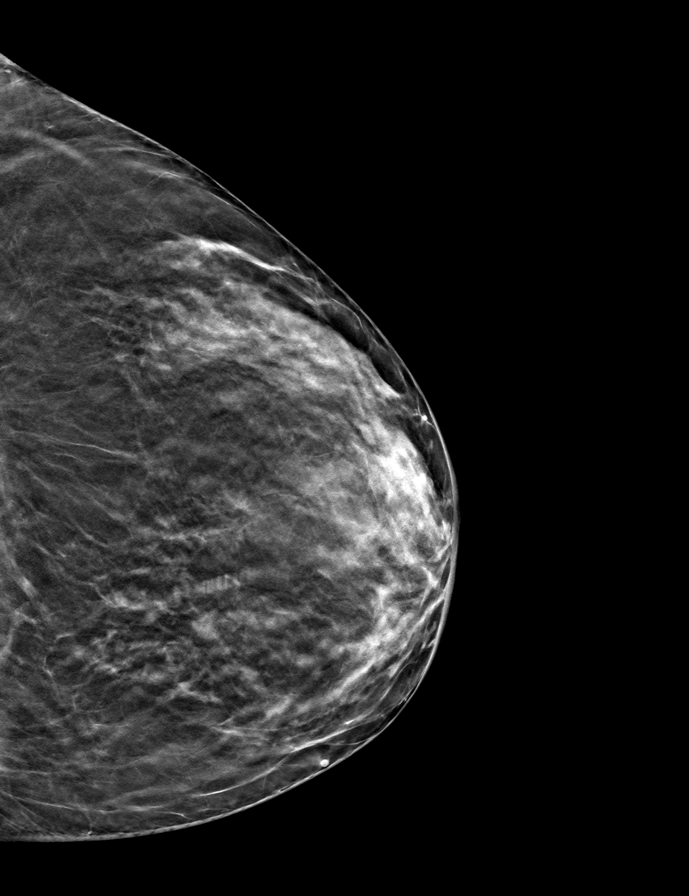

[L MLO tomo · tomo slice 26/51.0]
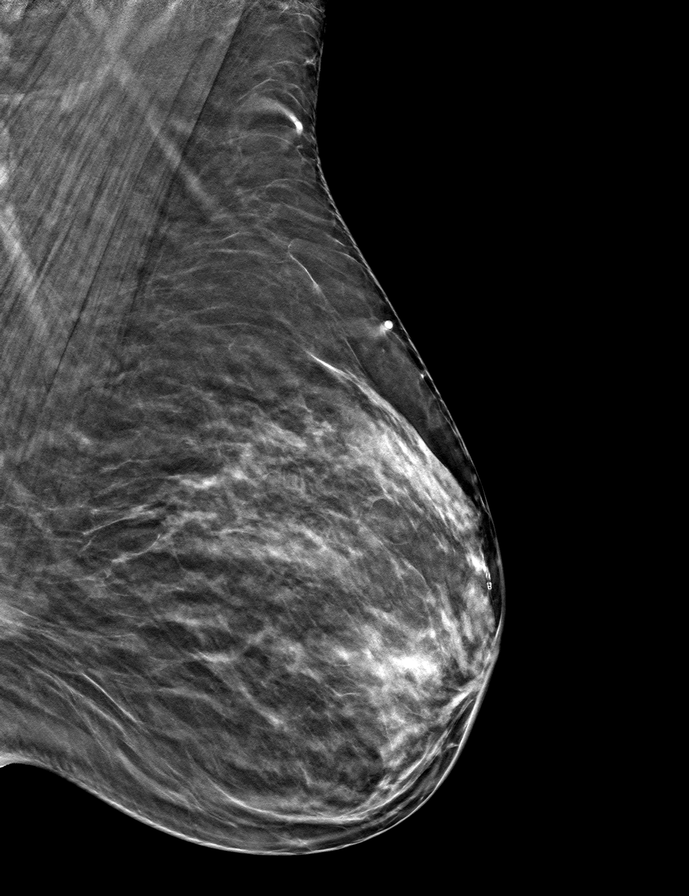

[9 of 24 positions shown; findings below may reference images not displayed]

ACR Breast Density Category c: The breast tissue is heterogeneously
dense, which may obscure small masses.
FINDINGS: There are no findings suspicious for malignancy. Stable post
lumpectomy changes involving the RIGHT breast. Images were processed
with CAD.
IMPRESSION: No mammographic evidence of malignancy. A result letter of this
screening mammogram will be mailed directly to the patient.

RECOMMENDATION:
Screening mammogram in one year. (Code:[id number]-X-68L)

BI-RADS CATEGORY  2: Benign.

## 2021-04-20 ENCOUNTER — Ambulatory Visit (INDEPENDENT_AMBULATORY_CARE_PROVIDER_SITE_OTHER): Payer: PRIVATE HEALTH INSURANCE | Admitting: Cardiovascular Disease

## 2021-04-20 ENCOUNTER — Other Ambulatory Visit: Payer: Self-pay | Admitting: *Deleted

## 2021-04-20 ENCOUNTER — Encounter: Payer: Self-pay | Admitting: Cardiovascular Disease

## 2021-04-20 ENCOUNTER — Other Ambulatory Visit: Payer: Self-pay

## 2021-04-20 ENCOUNTER — Ambulatory Visit: Payer: PRIVATE HEALTH INSURANCE

## 2021-04-20 ENCOUNTER — Other Ambulatory Visit: Payer: Self-pay | Admitting: Cardiovascular Disease

## 2021-04-20 VITALS — BP 101/65 | HR 55 | Ht 64.0 in | Wt 157.0 lb

## 2021-04-20 DIAGNOSIS — R0609 Other forms of dyspnea: Secondary | ICD-10-CM

## 2021-04-20 DIAGNOSIS — R002 Palpitations: Secondary | ICD-10-CM | POA: Diagnosis not present

## 2021-04-20 DIAGNOSIS — R0600 Dyspnea, unspecified: Secondary | ICD-10-CM | POA: Diagnosis not present

## 2021-04-20 DIAGNOSIS — R079 Chest pain, unspecified: Secondary | ICD-10-CM

## 2021-04-20 DIAGNOSIS — R0789 Other chest pain: Secondary | ICD-10-CM

## 2021-04-20 DIAGNOSIS — G2581 Restless legs syndrome: Secondary | ICD-10-CM | POA: Diagnosis not present

## 2021-04-20 DIAGNOSIS — Z853 Personal history of malignant neoplasm of breast: Secondary | ICD-10-CM

## 2021-04-20 MED ORDER — METOPROLOL TARTRATE 25 MG PO TABS: ORAL_TABLET | ORAL | 3 refills | Status: DC

## 2021-04-20 NOTE — Progress Notes (Signed)
Cardiology Office Note    Date:  04/20/2021   ID:  Alyssa [name], Alyssa [name] 1956-07-24, MRN [id number]  PCP:  Mila Palmer, MD  Cardiologist:  Nicki Guadalajara, MD   New cardiology consultation referred through the courtesy of Dr. Mila Palmer for evaluation of anterior chest pain.  History of Present Illness:  Alyssa [name] is a 65 y.o. female who was recently evaluated by Dr. Johnn Hai with complaints of anterior chest pain that has developed over the past several months.  Patient states that when she does not develops this tightness in her chest she needs to take a deep breath.  She has noticed associated palpitations which can last 1 minute up to 10 minutes.  Her symptoms initially began in February through May and would occur approximately 4-5 times.  She drinks 1-1/2 cups of caffeine per day.  She initially had seen Dr. Laurine Blazer and was given a prescription for potential reflux but she did not derive any benefit with pantoprazole.  The patient states she walks daily.  She often can walk up to 5 miles and doing well.  But recently she went on 8 mile walk in between the fifth and 8 mile she definitely experienced substernal chest tightness and increasing shortness of breath.  She admits that she has been under increased family stress and has been alienated by her son who does not allow her to see her to granddaughters.  This has created significant stress and anxiety.  As result she is not sleeping well.  She has restless legs.  Her sleep has been nonrestorative and she has frequent awakenings.  She has seen Dr. Laurine Blazer in April 2022 initially with these complaints and is now referred for cardiology evaluation.  She has remote history of breast cancer in 2002 and at the time underwent 3 months of radiation treatment and 5 years of tamoxifen.  She had had prior laboratory in 2021 which showed normal chemistry TSH.  Total cholesterol was 185, HDL 73, triglycerides 48, and LDL 105.  She  presents for evaluation   Past Medical History:  Diagnosis Date   Cataract, immature    bilateral   Dental bridge present    upper and lower   Dental crowns present    6-7   Diverticulosis 2013   Severe Diverticulosis by colonoscopy    History of breast cancer    right   History of lumpectomy of right breast 2002   Microcalcifications of the breast 10/2014   left   Obesity    Radiculopathy, lumbar region     Past Surgical History:  Procedure Laterality Date   BRCA inconclusive     BREAST BIOPSY Left 11/18/2014   Procedure: LEFT NIPPLE REMOVAL;  Surgeon: Avel Peace, MD;  Location: [facility];  Service: General;  Laterality: Left;   BREAST LUMPECTOMY Right    CARPAL TUNNEL RELEASE Right 05/27/2008   COLONOSCOPY WITH PROPOFOL  05/01/2012   DILATION AND CURETTAGE OF UTERUS     KNEE ARTHROSCOPY     x 3 - 2 on left, 1 on right   left meniscus tear repair     left nipple removal      LYMPH NODE DISSECTION     right carpal tunnel release     right meniscus tear repair     right shoulder surgery     bone chip removal, arthroscopic    SHOULDER ARTHROSCOPY Right 05/27/2008   TONSILLECTOMY     TUBAL LIGATION  Current Medications: Outpatient Medications Prior to Visit  Medication Sig Dispense Refill   calcium acetate, Phos Binder, (PHOSLYRA) 667 MG/5ML SOLN Take by mouth 3 (three) times daily with meals.     cholecalciferol (VITAMIN D) 400 UNITS TABS Take by mouth daily. Take 800 units daily     aspirin 81 MG tablet Take 81 mg by mouth daily.     ibuprofen (ADVIL) 200 MG tablet Take 800 mg by mouth 3 (three) times daily.     omeprazole (PRILOSEC) 40 MG capsule Take 40 mg by mouth daily.     No facility-administered medications prior to visit.     Allergies:   Other   Social History   Socioeconomic History   Marital status: Married    Spouse name: Not on file   Number of children: Not on file   Years of education: Not on file   Highest education  level: Not on file  Occupational History   Not on file  Tobacco Use   Smoking status: Never   Smokeless tobacco: Never  Substance and Sexual Activity   Alcohol use: Yes    Comment: occasionally   Drug use: No   Sexual activity: Not on file  Other Topics Concern   Not on file  Social History Narrative   Not on file   Social Determinants of Health   Financial Resource Strain: Not on file  Food Insecurity: Not on file  Transportation Needs: Not on file  Physical Activity: Not on file  Stress: Not on file  Social Connections: Not on file    Social history is notable in that she was born in [place] outside [place].  However she lived in [place] as well as [place].  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  She is married for 41 years and has 3 children and 5 grandchildren.  Previously she had worked for [employer].  She rarely drinks alcohol.  She walks regularly   Family History:  The patient's family history includes Heart disease in her father.   Family history is notable that her mother is 47 and living.  Her father died of a myocardial infarction at age 32.  A brother died at 55 with multiple myeloma.  Another brother age 20 is still living but has CAD and has undergone stenting.  1 of 3 sisters also had breast cancer.  Her 24 year old son has sleep apnea 2 other children are without known disease.   ROS General: Negative; No fevers, chills, or night sweats;  HEENT: Negative; No changes in vision or hearing, sinus congestion, difficulty swallowing Pulmonary: Negative; No cough, wheezing, shortness of breath, hemoptysis Cardiovascular: Negative; No chest pain, presyncope, syncope, palpitations GI: Negative; No nausea, vomiting, diarrhea, or abdominal pain GU: Negative; No dysuria, hematuria, or difficulty voiding Musculoskeletal: Negative; no myalgias, joint pain, or weakness Hematologic/Oncology: Negative; no easy  bruising, bleeding Endocrine: Negative; no heat/cold intolerance; no diabetes Neuro: Negative; no changes in balance, headaches Skin: Negative; No rashes or skin lesions Psychiatric: Negative; No behavioral problems, depression Sleep: Poor sleep, restless legs, nonrestorative sleep, probable snoring; no bruxism, hypnogognic hallucinations, no cataplexy Other comprehensive 14 point system review is negative.   PHYSICAL EXAM:   VS:  BP 101/65   Pulse (!) 55   Ht $R'5\' 4"'jc$  (1.626 m)   Wt 157 lb (71.2 kg)   SpO2 99%   BMI 26.95 kg/m     Repeat blood pressure by me  was 116/70  Wt Readings from Last 3 Encounters:  04/20/21 157 lb (71.2 kg)  11/18/14 188 lb (85.3 kg)  05/01/12 194 lb (88 kg)    General: Alert, oriented, no distress.  Skin: normal turgor, no rashes, warm and dry HEENT: Normocephalic, atraumatic. Pupils equal round and reactive to light; sclera anicteric; extraocular muscles intact;  Nose without nasal septal hypertrophy Mouth/Parynx benign; Mallinpatti scale 3 Neck: No JVD, no carotid bruits; normal carotid upstroke Lungs: clear to ausculatation and percussion; no wheezing or rales Chest wall: without tenderness to palpitation Heart: PMI not displaced, RRR, s1 s2 normal, 1/6 systolic murmur, no diastolic murmur, no rubs, gallops, thrills, or heaves Abdomen: soft, nontender; no hepatosplenomehaly, BS+; abdominal aorta nontender and not dilated by palpation. Back: no CVA tenderness Pulses 2+ Musculoskeletal: full range of motion, normal strength, no joint deformities Extremities: no clubbing cyanosis or edema, Homan's sign negative  Neurologic: grossly nonfocal; Cranial nerves grossly wnl Psychologic: Normal mood and affect   Studies/Labs Reviewed:   EKG:  EKG is ordered today.  Sinus bradycardia at 55; no ectopy; normal intervals  Recent Labs: BMP Latest Ref Rng & Units 11/16/2014 06/01/2011 04/28/2010  Glucose 70 - 99 mg/dL 73 79 76  BUN 6 - 23 mg/dL $Remove'14 12 16   'zhTQnbG$ Creatinine 0.50 - 1.10 mg/dL 0.83 0.79 0.88  Sodium 135 - 145 mmol/L 141 141 142  Potassium 3.5 - 5.1 mmol/L 4.7 4.3 4.3  Chloride 96 - 112 mmol/L 108 107 108  CO2 19 - 32 mmol/L $RemoveB'25 26 24  'geqNnuky$ Calcium 8.4 - 10.5 mg/dL 9.1 9.9 9.0     Hepatic Function Latest Ref Rng & Units 11/16/2014 06/01/2011 04/28/2010  Total Protein 6.0 - 8.3 g/dL 7.0 6.9 6.7  Albumin 3.5 - 5.2 g/dL 3.8 4.4 3.9  AST 0 - 37 U/L $Remo'20 14 14  'KNYoI$ ALT 0 - 35 U/L $Remo'18 10 10  'BBgLf$ Alk Phosphatase 39 - 117 U/L 58 53 53  Total Bilirubin 0.3 - 1.2 mg/dL 0.6 0.3 0.4    CBC Latest Ref Rng & Units 11/16/2014 06/01/2011 04/28/2010  WBC 4.0 - 10.5 K/uL 5.3 6.4 6.9  Hemoglobin 12.0 - 15.0 g/dL 13.5 13.4 12.7  Hematocrit 36.0 - 46.0 % 41.4 39.8 38.5  Platelets 150 - 400 K/uL 372 379 380   Lab Results  Component Value Date   MCV 90.8 11/16/2014   MCV 93.7 06/01/2011   MCV 91.7 04/28/2010   No results found for: TSH No results found for: HGBA1C   BNP No results found for: BNP  ProBNP No results found for: PROBNP   Lipid Panel  No results found for: CHOL, TRIG, HDL, CHOLHDL, VLDL, LDLCALC, LDLDIRECT, LABVLDL   RADIOLOGY: No results found.   Additional studies/ records that were reviewed today include:  I reviewed the [facility] records from Dr. Jonathon Jordan   ASSESSMENT:    1. Chest tightness   2. Palpitations   3. Exertional dyspnea   4. Restless legs   5. History of breast cancer     PLAN:  Ms. Alyssa [name] is a very pleasant 65 year old female who is not on any cardiac medication.  Recently since February she has started to develop episodes of tightness in her chest and feels like she needs to take a deep breath.  She also has experienced sensation of palpitations which are not isolated but at times can last a minute and on one occasion lasted approximately 10 minutes.  This was unassociated with any presyncope or syncope.  She  states she drinks minimal caffeine and oftentimes has a cup and a half of coffee or diet soda but  when she does she usually mixes it with decaffeinated beverage.  Remotely she had undergone radiation therapy for breast cancer in 2002.  She walks daily and typically does well but recently during a prolonged walk after she reached a certain mileage she be came very short of breath and experience some chest tightness.  She also has been under increased family stress and apparently her son has alienated her and will not allow her to see her 2 granddaughters who she cares much about.  She also has had issues with poor sleep with nonrestorative sleep, frequent awakenings and snoring.  Presently, I am recommending she undergo a 2D echo Doppler study to evaluate systolic and diastolic function as well as valvular architecture.  To further evaluate her palpitations I am scheduling her to wear a 2-week Zio patch monitor.  With her chest tightness, I have recommended she undergo a coronary CTA to evaluate a calcium score and coronary anatomy regarding plaque and calcification.  I am empirically starting on her prescription for metoprolol tartrate 25 mg to take on an as-needed basis if she experiences another episode of prolonged palpitation.  With a resting pulse of 55 I will not start her on daily therapy.  I am concerned she may also have a component of obstructive sleep apnea and for this reason I am scheduling her for a home sleep study.  I will contact her regarding the above studies and plan to see her in 6 weeks for follow-up office evaluation.   Medication Adjustments/Labs and Tests Ordered: Current medicines are reviewed at length with the patient today.  Concerns regarding medicines are outlined above.  Medication changes, Labs and Tests ordered today are listed in the Patient Instructions below. Patient Instructions  Medication Instructions:  Take metoprolol tartrate 12.5-25mg  (1/2 tablet to 1 tablet) AS NEEDED for palpitations.   *If you need a refill on your cardiac medications before your next  appointment, please call your pharmacy*   Lab Work: None ordered.     Testing/Procedures:  To be scheduled at [facility]: Your physician has requested that you have an echocardiogram. Echocardiography is a painless test that uses sound waves to create images of your heart. It provides your doctor with information about the size and shape of your heart and how well your heart's chambers and valves are working. This procedure takes approximately one hour. There are no restrictions for this procedure.  Your physician has recommended that you have a sleep study. This test records several body functions during sleep, including: brain activity, eye movement, oxygen and carbon dioxide blood levels, heart rate and rhythm, breathing rate and rhythm, the flow of air through your mouth and nose, snoring, body muscle movements, and chest and belly movement.      Your cardiac CT will be scheduled at one of the below locations:   [facility] [address] [place], [place] [address] [phone]   If scheduled at [facility], please arrive at the [facility] main entrance (entrance A) of [facility] 30 minutes prior to test start time. Proceed to the [facility] Radiology Department (first floor) to check-in and test prep.   Please follow these instructions carefully (unless otherwise directed):  Hold all erectile dysfunction medications at least 3 days (72 hrs) prior to test.  On the Night Before the Test: Be sure to Drink plenty  of water. Do not consume any caffeinated/decaffeinated beverages or chocolate 12 hours prior to your test. Do not take any antihistamines 12 hours prior to your test.  On the Day of the Test: Drink plenty of water until 1 hour prior to the test. Do not eat any food 4 hours prior to the test. You may take your regular medications prior to the test.  FEMALES- please wear underwire-free bra if available          After the  Test: Drink plenty of water. After receiving IV contrast, you may experience a mild flushed feeling. This is normal. On occasion, you may experience a mild rash up to 24 hours after the test. This is not dangerous. If this occurs, you can take Benadryl 25 mg and increase your fluid intake. If you experience trouble breathing, this can be serious. If it is severe call 911 IMMEDIATELY. If it is mild, please call our office. If you take any of these medications: Glipizide/Metformin, Avandament, Glucavance, please do not take 48 hours after completing test unless otherwise instructed.   Once we have confirmed authorization from your insurance company, we will call you to set up a date and time for your test. Based on how quickly your insurance processes prior authorizations requests, please allow up to 4 weeks to be contacted for scheduling your Cardiac CT appointment. Be advised that routine Cardiac CT appointments could be scheduled as many as 8 weeks after your provider has ordered it.  For non-scheduling related questions, please contact the cardiac imaging nurse navigator should you have any questions/concerns: Marchia Bond, Cardiac Imaging Nurse Navigator Gordy Clement, Cardiac Imaging Nurse Navigator [facility] Direct Office Dial: [phone]   For scheduling needs, including cancellations and rescheduling, please call [place], [phone].    Follow-Up: At [facility], you and your health needs are our priority.  As part of our continuing mission to provide you with exceptional heart care, we have created designated Provider Care Teams.  These Care Teams include your primary Cardiologist (physician) and Advanced Practice Providers (APPs -  Physician Assistants and Nurse Practitioners) who all work together to provide you with the care you need, when you need it.  We recommend signing up for the patient portal called "MyChart".  Sign up information is  provided on this After Visit Summary.  MyChart is used to connect with patients for Virtual Visits (Telemedicine).  Patients are able to view lab/test results, encounter notes, upcoming appointments, etc.  Non-urgent messages can be sent to your provider as well.   To learn more about what you can do with MyChart, go to [email or web address].    Your next appointment:   6 week(s)  The format for your next appointment:   In Person  Provider:   Shelva Majestic, MD   Other Instructions Bryn Gulling- Long Term Monitor Instructions  Your physician has requested you wear a ZIO patch monitor for 14 days.  This is a single patch monitor. [employer] supplies one patch monitor per enrollment. Additional stickers are not available. Please do not apply patch if you will be having a Nuclear Stress Test,  Echocardiogram, Cardiac CT, MRI, or Chest Xray during the period you would be wearing the  monitor. The patch cannot be worn during these tests. You cannot remove and re-apply the  ZIO XT patch monitor.  Your ZIO patch monitor will be mailed 3 day USPS to your address on file. It may take 3-5 days  to receive  your monitor after you have been enrolled.  Once you have received your monitor, please review the enclosed instructions. Your monitor  has already been registered assigning a specific monitor serial # to you.  Billing and Patient Assistance Program Information  We have supplied [employer] with any of your insurance information on file for billing purposes. [employer] offers a sliding scale Patient Assistance Program for patients that do not have  insurance, or whose insurance does not completely cover the cost of the ZIO monitor.  You must apply for the Patient Assistance Program to qualify for this discounted rate.  To apply, please call [employer] at [phone], select option 4, select option 2, ask to apply for  Patient Assistance Program. Theodore Demark will ask your household income, and how many people   are in your household. They will quote your out-of-pocket cost based on that information.  [employer] will also be able to set up a 26-month, interest-free payment plan if needed.  Applying the monitor   Shave hair from upper left chest.  Hold abrader disc by orange tab. Rub abrader in 40 strokes over the upper left chest as  indicated in your monitor instructions.  Clean area with 4 enclosed alcohol pads. Let dry.  Apply patch as indicated in monitor instructions. Patch will be placed under collarbone on left  side of chest with arrow pointing upward.  Rub patch adhesive wings for 2 minutes. Remove white label marked "1". Remove the white  label marked "2". Rub patch adhesive wings for 2 additional minutes.  While looking in a mirror, press and release button in center of patch. A small green light will  flash 3-4 times. This will be your only indicator that the monitor has been turned on.  Do not shower for the first 24 hours. You may shower after the first 24 hours.  Press the button if you feel a symptom. You will hear a small click. Record Date, Time and  Symptom in the Patient Logbook.  When you are ready to remove the patch, follow instructions on the last 2 pages of Patient  Logbook. Stick patch monitor onto the last page of Patient Logbook.  Place Patient Logbook in the blue and white box. Use locking tab on box and tape box closed  securely. The blue and white box has prepaid postage on it. Please place it in the mailbox as  soon as possible. Your physician should have your test results approximately 7 days after the  monitor has been mailed back to [facility].  Call [place] at [phone] if you have questions regarding  your ZIO XT patch monitor. Call them immediately if you see an orange light blinking on your  monitor.  If your monitor falls off in less than 4 days, contact our Monitor department at [phone].  If your monitor becomes loose or  falls off after 4 days call [employer] at [phone] for  suggestions on securing your monitor    Signed, Shelva Majestic, MD  04/20/2021 8:44 PM    [facility] [address] [place], [place], [place]  [address] Phone: [phone]

## 2021-04-20 NOTE — Patient Instructions (Addendum)
Medication Instructions:  Take metoprolol tartrate 12.5-25mg  (1/2 tablet to 1 tablet) AS NEEDED for palpitations.   *If you need a refill on your cardiac medications before your next appointment, please call your pharmacy*   Lab Work: None ordered.     Testing/Procedures:  To be scheduled at [facility]: Your physician has requested that you have an echocardiogram. Echocardiography is a painless test that uses sound waves to create images of your heart. It provides your doctor with information about the size and shape of your heart and how well your heart's chambers and valves are working. This procedure takes approximately one hour. There are no restrictions for this procedure.  Your physician has recommended that you have a sleep study. This test records several body functions during sleep, including: brain activity, eye movement, oxygen and carbon dioxide blood levels, heart rate and rhythm, breathing rate and rhythm, the flow of air through your mouth and nose, snoring, body muscle movements, and chest and belly movement.      Your cardiac CT will be scheduled at one of the below locations:   [facility] [address] [place], [place] [address] [phone]   If scheduled at [facility], please arrive at the [facility] main entrance (entrance A) of [facility] 30 minutes prior to test start time. Proceed to the [facility] Radiology Department (first floor) to check-in and test prep.   Please follow these instructions carefully (unless otherwise directed):  Hold all erectile dysfunction medications at least 3 days (72 hrs) prior to test.  On the Night Before the Test: Be sure to Drink plenty of water. Do not consume any caffeinated/decaffeinated beverages or chocolate 12 hours prior to your test. Do not take any antihistamines 12 hours prior to your test.  On the Day of the Test: Drink plenty of water until 1 hour prior to the test. Do  not eat any food 4 hours prior to the test. You may take your regular medications prior to the test.  FEMALES- please wear underwire-free bra if available          After the Test: Drink plenty of water. After receiving IV contrast, you may experience a mild flushed feeling. This is normal. On occasion, you may experience a mild rash up to 24 hours after the test. This is not dangerous. If this occurs, you can take Benadryl 25 mg and increase your fluid intake. If you experience trouble breathing, this can be serious. If it is severe call 911 IMMEDIATELY. If it is mild, please call our office. If you take any of these medications: Glipizide/Metformin, Avandament, Glucavance, please do not take 48 hours after completing test unless otherwise instructed.   Once we have confirmed authorization from your insurance company, we will call you to set up a date and time for your test. Based on how quickly your insurance processes prior authorizations requests, please allow up to 4 weeks to be contacted for scheduling your Cardiac CT appointment. Be advised that routine Cardiac CT appointments could be scheduled as many as 8 weeks after your provider has ordered it.  For non-scheduling related questions, please contact the cardiac imaging nurse navigator should you have any questions/concerns: Marchia Bond, Cardiac Imaging Nurse Navigator Gordy Clement, Cardiac Imaging Nurse Navigator [facility] Direct Office Dial: [phone]   For scheduling needs, including cancellations and rescheduling, please call [place], [phone].    Follow-Up: At [facility], you and your health needs  are our priority.  As part of our continuing mission to provide you with exceptional heart care, we have created designated Provider Care Teams.  These Care Teams include your primary Cardiologist (physician) and Advanced Practice Providers (APPs -  Physician Assistants and Nurse  Practitioners) who all work together to provide you with the care you need, when you need it.  We recommend signing up for the patient portal called "MyChart".  Sign up information is provided on this After Visit Summary.  MyChart is used to connect with patients for Virtual Visits (Telemedicine).  Patients are able to view lab/test results, encounter notes, upcoming appointments, etc.  Non-urgent messages can be sent to your provider as well.   To learn more about what you can do with MyChart, go to [email or web address].    Your next appointment:   6 week(s)  The format for your next appointment:   In Person  Provider:   Shelva Majestic, MD   Other Instructions Bryn Gulling- Long Term Monitor Instructions  Your physician has requested you wear a ZIO patch monitor for 14 days.  This is a single patch monitor. [employer] supplies one patch monitor per enrollment. Additional stickers are not available. Please do not apply patch if you will be having a Nuclear Stress Test,  Echocardiogram, Cardiac CT, MRI, or Chest Xray during the period you would be wearing the  monitor. The patch cannot be worn during these tests. You cannot remove and re-apply the  ZIO XT patch monitor.  Your ZIO patch monitor will be mailed 3 day USPS to your address on file. It may take 3-5 days  to receive your monitor after you have been enrolled.  Once you have received your monitor, please review the enclosed instructions. Your monitor  has already been registered assigning a specific monitor serial # to you.  Billing and Patient Assistance Program Information  We have supplied [employer] with any of your insurance information on file for billing purposes. [employer] offers a sliding scale Patient Assistance Program for patients that do not have  insurance, or whose insurance does not completely cover the cost of the ZIO monitor.  You must apply for the Patient Assistance Program to qualify for this discounted rate.  To  apply, please call [employer] at [phone], select option 4, select option 2, ask to apply for  Patient Assistance Program. Theodore Demark will ask your household income, and how many people  are in your household. They will quote your out-of-pocket cost based on that information.  [employer] will also be able to set up a 26-month, interest-free payment plan if needed.  Applying the monitor   Shave hair from upper left chest.  Hold abrader disc by orange tab. Rub abrader in 40 strokes over the upper left chest as  indicated in your monitor instructions.  Clean area with 4 enclosed alcohol pads. Let dry.  Apply patch as indicated in monitor instructions. Patch will be placed under collarbone on left  side of chest with arrow pointing upward.  Rub patch adhesive wings for 2 minutes. Remove white label marked "1". Remove the white  label marked "2". Rub patch adhesive wings for 2 additional minutes.  While looking in a mirror, press and release button in center of patch. A small green light will  flash 3-4 times. This will be your only indicator that the monitor has been turned on.  Do not shower for the first 24 hours. You may shower after the first 24 hours.  Press the  button if you feel a symptom. You will hear a small click. Record Date, Time and  Symptom in the Patient Logbook.  When you are ready to remove the patch, follow instructions on the last 2 pages of Patient  Logbook. Stick patch monitor onto the last page of Patient Logbook.  Place Patient Logbook in the blue and white box. Use locking tab on box and tape box closed  securely. The blue and white box has prepaid postage on it. Please place it in the mailbox as  soon as possible. Your physician should have your test results approximately 7 days after the  monitor has been mailed back to [facility].  Call [place] at [phone] if you have questions regarding  your ZIO XT patch monitor. Call them immediately if  you see an orange light blinking on your  monitor.  If your monitor falls off in less than 4 days, contact our Monitor department at [phone].  If your monitor becomes loose or falls off after 4 days call [employer] at [phone] for  suggestions on securing your monitor

## 2021-04-20 NOTE — Progress Notes (Unsigned)
Patient enrolled for [employer] to mail a ZIO XT monitor to address on file. 

## 2021-04-27 ENCOUNTER — Other Ambulatory Visit: Payer: Self-pay | Admitting: *Deleted

## 2021-04-27 DIAGNOSIS — R0789 Other chest pain: Secondary | ICD-10-CM

## 2021-04-27 LAB — BASIC METABOLIC PANEL
BUN/Creatinine Ratio: 15 (ref 12–28)
BUN: 13 mg/dL (ref 8–27)
CO2: 23 mmol/L (ref 20–29)
Calcium: 9.6 mg/dL (ref 8.7–10.3)
Chloride: 102 mmol/L (ref 96–106)
Creatinine, Ser: 0.84 mg/dL (ref 0.57–1.00)
Glucose: 87 mg/dL (ref 65–99)
Potassium: 5.1 mmol/L (ref 3.5–5.2)
Sodium: 140 mmol/L (ref 134–144)
eGFR: 78 mL/min/{1.73_m2} (ref >59)

## 2021-05-01 ENCOUNTER — Other Ambulatory Visit: Payer: Self-pay

## 2021-05-01 DIAGNOSIS — Z01812 Encounter for preprocedural laboratory examination: Secondary | ICD-10-CM

## 2021-05-08 ENCOUNTER — Telehealth: Payer: Self-pay

## 2021-05-08 ENCOUNTER — Encounter (HOSPITAL_COMMUNITY): Payer: Self-pay | Admitting: Radiology

## 2021-05-08 NOTE — Progress Notes (Unsigned)
Patient ID: Alyssa [name], female   DOB: [birth year], 65 y.o.   MRN: [id number]  Patient (pt no longer needs appt). Patient has cancelled her echocardiogram appointment. We will remove the order from the WQ.

## 2021-05-08 NOTE — Telephone Encounter (Signed)
Called patient to remind her to come in to get labs prior to CT scan. Left voicemail for patient to return office's call.

## 2021-05-10 ENCOUNTER — Other Ambulatory Visit (HOSPITAL_COMMUNITY): Payer: PRIVATE HEALTH INSURANCE

## 2021-05-10 ENCOUNTER — Ambulatory Visit (HOSPITAL_COMMUNITY): Payer: PRIVATE HEALTH INSURANCE

## 2021-08-10 DIAGNOSIS — H8110 Benign paroxysmal vertigo, unspecified ear: Secondary | ICD-10-CM | POA: Diagnosis present

## 2021-08-10 DIAGNOSIS — H6122 Impacted cerumen, left ear: Secondary | ICD-10-CM | POA: Insufficient documentation

## 2021-10-03 ENCOUNTER — Telehealth: Payer: Self-pay

## 2021-10-03 NOTE — Telephone Encounter (Signed)
Letter has been sent to patient informing them that their sleep study has expired. Patient will need to call and schedule an office visit to re-evaluate the need for a sleep study.    

## 2022-04-09 ENCOUNTER — Encounter: Payer: Self-pay | Admitting: Internal Medicine

## 2022-04-30 ENCOUNTER — Ambulatory Visit (AMBULATORY_SURGERY_CENTER): Payer: Self-pay | Admitting: *Deleted

## 2022-04-30 VITALS — Ht 64.0 in | Wt 172.0 lb

## 2022-04-30 DIAGNOSIS — Z1211 Encounter for screening for malignant neoplasm of colon: Secondary | ICD-10-CM

## 2022-04-30 MED ORDER — NA SULFATE-K SULFATE-MG SULF 17.5-3.13-1.6 GM/177ML PO SOLN: 1.0000 | Freq: Once | ORAL | 0 refills | Status: AC

## 2022-04-30 NOTE — Progress Notes (Signed)
No egg or soy allergy known to patient  No issues known to pt with past sedation with any surgeries or procedures Patient denies ever being told they had issues or difficulty with intubation  No FH of Malignant Hyperthermia Pt is not on diet pills Pt is not on  home 02  Pt is not on blood thinners  Pt denies issues with constipation  No A fib or A flutter Have any cardiac testing pending--no Pt instructed to use Singlecare.com or GoodRx for a price reduction on prep   

## 2022-05-22 ENCOUNTER — Encounter: Payer: Self-pay | Admitting: Internal Medicine

## 2022-05-22 ENCOUNTER — Ambulatory Visit (AMBULATORY_SURGERY_CENTER): Payer: Medicare Other | Admitting: Internal Medicine

## 2022-05-22 VITALS — BP 109/64 | HR 57 | Temp 97.3°F | Resp 12 | Ht 64.0 in | Wt 172.0 lb

## 2022-05-22 DIAGNOSIS — Z1211 Encounter for screening for malignant neoplasm of colon: Secondary | ICD-10-CM

## 2022-05-22 MED ORDER — SODIUM CHLORIDE 0.9 % IV SOLN: 500.0000 mL | Freq: Once | INTRAVENOUS | Status: DC

## 2022-05-22 NOTE — Progress Notes (Signed)
Report to PACU, RN, vss, BBS= Clear.  

## 2022-05-22 NOTE — Op Note (Signed)
[place] Patient Name: Alyssa [name] Procedure Date: 05/22/2022 8:26 AM MRN: [id number] Endoscopist: Docia Chuck. Henrene Pastor , MD Age: 66 Referring MD:  Date of Birth: 06-21-1956 Gender: Female Account #: [id number] Procedure:                Colonoscopy Indications:              Screening for colorectal malignant neoplasm.                            Previous examinations 2002 and 2013 were negative                            for neoplasia Medicines:                Monitored Anesthesia Care Procedure:                Pre-Anesthesia Assessment:                           - Prior to the procedure, a History and Physical                            was performed, and patient medications and                            allergies were reviewed. The patient's tolerance of                            previous anesthesia was also reviewed. The risks                            and benefits of the procedure and the sedation                            options and risks were discussed with the patient.                            All questions were answered, and informed consent                            was obtained. Prior Anticoagulants: The patient has                            taken no previous anticoagulant or antiplatelet                            agents. ASA Grade Assessment: I - A normal, healthy                            patient. After reviewing the risks and benefits,                            the patient was deemed in satisfactory condition to  undergo the procedure.                           After obtaining informed consent, the colonoscope                            was passed under direct vision. Throughout the                            procedure, the patient's blood pressure, pulse, and                            oxygen saturations were monitored continuously. The                            CF [id number] #[id number] was introduced through the anus                             and advanced to the the cecum, identified by                            appendiceal orifice and ileocecal valve. The                            ileocecal valve, appendiceal orifice, and rectum                            were photographed. The quality of the bowel                            preparation was excellent. The colonoscopy was                            performed without difficulty. The patient tolerated                            the procedure well. The bowel preparation used was                            SUPREP via split dose instruction. Scope In: 8:33:08 AM Scope Out: 8:46:20 AM Scope Withdrawal Time: 0 hours 10 minutes 8 seconds  Total Procedure Duration: 0 hours 13 minutes 12 seconds  Findings:                 Many small and large-mouthed diverticula were found                            in the entire colon. Small internal hemorrhoids.                           The exam was otherwise without abnormality on                            direct and retroflexion views. Complications:  No immediate complications. Estimated blood loss:                            None. Estimated Blood Loss:     Estimated blood loss: none. Impression:               - Diverticulosis in the entire examined colon.                            Small internal hemorrhoids                           - The examination was otherwise normal on direct                            and retroflexion views.                           - No specimens collected. Recommendation:           - Repeat colonoscopy in 10 years for screening                            purposes.                           - Patient has a contact number available for                            emergencies. The signs and symptoms of potential                            delayed complications were discussed with the                            patient. Return to normal activities tomorrow.                            Written  discharge instructions were provided to the                            patient.                           - Resume previous diet.                           - Continue present medications. Docia Chuck. Henrene Pastor, MD 05/22/2022 8:51:23 AM This report has been signed electronically.

## 2022-05-22 NOTE — Patient Instructions (Signed)
Handout on hemorrhoids and diverticulosis.  Repeat colonoscopy for surveillance in 10 years!   YOU HAD AN ENDOSCOPIC PROCEDURE TODAY AT [facility]:   Refer to the procedure report that was given to you for any specific questions about what was found during the examination.  If the procedure report does not answer your questions, please call your gastroenterologist to clarify.  If you requested that your care partner not be given the details of your procedure findings, then the procedure report has been included in a sealed envelope for you to review at your convenience later.  YOU SHOULD EXPECT: Some feelings of bloating in the abdomen. Passage of more gas than usual.  Walking can help get rid of the air that was put into your GI tract during the procedure and reduce the bloating. If you had a lower endoscopy (such as a colonoscopy or flexible sigmoidoscopy) you may notice spotting of blood in your stool or on the toilet paper. If you underwent a bowel prep for your procedure, you may not have a normal bowel movement for a few days.  Please Note:  You might notice some irritation and congestion in your nose or some drainage.  This is from the oxygen used during your procedure.  There is no need for concern and it should clear up in a day or so.  SYMPTOMS TO REPORT IMMEDIATELY:  Following lower endoscopy (colonoscopy or flexible sigmoidoscopy):  Excessive amounts of blood in the stool  Significant tenderness or worsening of abdominal pains  Swelling of the abdomen that is new, acute  Fever of 100F or higher  For urgent or emergent issues, a gastroenterologist can be reached at any hour by calling [phone]. Do not use MyChart messaging for urgent concerns.    DIET:  We do recommend a small meal at first, but then you may proceed to your regular diet.  Drink plenty of fluids but you should avoid alcoholic beverages for 24 hours.  ACTIVITY:  You should plan to take it  easy for the rest of today and you should NOT DRIVE or use heavy machinery until tomorrow (because of the sedation medicines used during the test).    FOLLOW UP: Our staff will call the number listed on your records the next business day following your procedure.  We will call around 7:15- 8:00 am to check on you and address any questions or concerns that you may have regarding the information given to you following your procedure. If we do not reach you, we will leave a message.  If you develop any symptoms (ie: fever, flu-like symptoms, shortness of breath, cough etc.) before then, please call [phone].  If you test positive for Covid 19 in the 2 weeks post procedure, please call and report this information to [place].    If any biopsies were taken you will be contacted by phone or by letter within the next 1-3 weeks.  Please call us at [phone] if you have not heard about the biopsies in 3 weeks.    SIGNATURES/CONFIDENTIALITY: You and/or your care partner have signed paperwork which will be entered into your electronic medical record.  These signatures attest to the fact that that the information above on your After Visit Summary has been reviewed and is understood.  Full responsibility of the confidentiality of this discharge information lies with you and/or your care-partner.

## 2022-05-22 NOTE — Progress Notes (Signed)
HISTORY OF PRESENT ILLNESS:  Alyssa [name] is a 66 y.o. female who presents today for screening colonoscopy.  Previous examinations 2002 in 2013 were negative for neoplasia.  She does have diverticular disease.  No active complaints.  Tolerated prep  REVIEW OF SYSTEMS:  All non-GI ROS negative.  r  Past Medical History:  Diagnosis Date   Arthritis    fingers,back,knees   Cancer ([place])    breast,nipple removed form left breast,right breast cancer   Cataract, immature    bilateral   Dental bridge present    upper and lower   Dental crowns present    6-7   Diverticulosis 2013   Severe Diverticulosis by colonoscopy    History of breast cancer    right   History of lumpectomy of right breast 2002   Microcalcifications of the breast 10/2014   left   Obesity    Radiculopathy, lumbar region     Past Surgical History:  Procedure Laterality Date   BRCA inconclusive     BREAST BIOPSY Left 11/18/2014   Procedure: LEFT NIPPLE REMOVAL;  Surgeon: Jackolyn Confer, MD;  Location: [facility];  Service: General;  Laterality: Left;   BREAST LUMPECTOMY Right    CARPAL TUNNEL RELEASE Right 05/27/2008   cataracts Bilateral    COLONOSCOPY     COLONOSCOPY WITH PROPOFOL  05/01/2012   DILATION AND CURETTAGE OF UTERUS     KNEE ARTHROSCOPY     x 3 - 2 on left, 1 on right   left meniscus tear repair     left nipple removal      LYMPH NODE DISSECTION     right carpal tunnel release     right meniscus tear repair     right shoulder surgery     bone chip removal, arthroscopic    SHOULDER ARTHROSCOPY Right 05/27/2008   TONSILLECTOMY     TUBAL LIGATION      Social History Alyssa [name]  reports that she has never smoked. She has been exposed to tobacco smoke. She has never used smokeless tobacco. She reports current alcohol use. She reports that she does not use drugs.  family history includes Colon polyps in her father; Heart disease in her father.  Allergies   Allergen Reactions   Other Rash and Other (See Comments)    Back injection-Steroid Rash and Redness       PHYSICAL EXAMINATION: Vital signs: BP 103/68   Pulse 79   Temp (!) 97.3 F (36.3 C)   Ht $R'5\' 4"'eq$  (1.626 m)   Wt 172 lb (78 kg)   SpO2 97%   BMI 29.52 kg/m  General: Well-developed, well-nourished, no acute distress HEENT: Sclerae are anicteric, conjunctiva pink. Oral mucosa intact Lungs: Clear Heart: Regular Abdomen: soft, nontender, nondistended, no obvious ascites, no peritoneal signs, normal bowel sounds. No organomegaly. Extremities: No edema Psychiatric: alert and oriented x3. Cooperative      ASSESSMENT:  Colon cancer screening   PLAN:  Screening colonoscopy

## 2022-05-22 NOTE — Progress Notes (Signed)
Pt's states no medical or surgical changes since previsit or office visit. 

## 2022-05-23 ENCOUNTER — Telehealth: Payer: Self-pay | Admitting: *Deleted

## 2022-05-23 NOTE — Telephone Encounter (Signed)
  Follow up Call-     05/22/2022    7:28 AM  Call back number  Post procedure Call Back phone  # [phone]  Permission to leave phone message Yes     Patient questions:  Do you have a fever, pain , or abdominal swelling? No. Pain Score  0 *  Have you tolerated food without any problems? Yes.    Have you been able to return to your normal activities? Yes.    Do you have any questions about your discharge instructions: Diet   No. Medications  No. Follow up visit  No.  Do you have questions or concerns about your Care? No.  Actions: * If pain score is 4 or above: No action needed, pain <4.

## 2022-06-01 DIAGNOSIS — C50919 Malignant neoplasm of unspecified site of unspecified female breast: Secondary | ICD-10-CM | POA: Insufficient documentation

## 2024-01-25 ENCOUNTER — Encounter (HOSPITAL_COMMUNITY): Payer: Self-pay | Admitting: Emergency Medicine

## 2024-01-25 ENCOUNTER — Other Ambulatory Visit: Payer: Self-pay

## 2024-01-25 ENCOUNTER — Emergency Department (HOSPITAL_COMMUNITY)

## 2024-01-25 ENCOUNTER — Inpatient Hospital Stay (HOSPITAL_COMMUNITY)
Admission: EM | Admit: 2024-01-25 | Discharge: 2024-01-27 | DRG: 378 | Disposition: A | Discharge status: Home / Self Care | Attending: Internal Medicine | Admitting: Internal Medicine

## 2024-01-25 DIAGNOSIS — N83202 Unspecified ovarian cyst, left side: Secondary | ICD-10-CM | POA: Diagnosis present

## 2024-01-25 DIAGNOSIS — E669 Obesity, unspecified: Secondary | ICD-10-CM | POA: Diagnosis present

## 2024-01-25 DIAGNOSIS — D75839 Thrombocytosis, unspecified: Secondary | ICD-10-CM | POA: Diagnosis present

## 2024-01-25 DIAGNOSIS — K921 Melena: Secondary | ICD-10-CM | POA: Diagnosis not present

## 2024-01-25 DIAGNOSIS — D649 Anemia, unspecified: Secondary | ICD-10-CM | POA: Diagnosis not present

## 2024-01-25 DIAGNOSIS — R739 Hyperglycemia, unspecified: Secondary | ICD-10-CM | POA: Diagnosis present

## 2024-01-25 DIAGNOSIS — K5733 Diverticulitis of large intestine without perforation or abscess with bleeding: Secondary | ICD-10-CM | POA: Diagnosis present

## 2024-01-25 DIAGNOSIS — Z8249 Family history of ischemic heart disease and other diseases of the circulatory system: Secondary | ICD-10-CM | POA: Diagnosis not present

## 2024-01-25 DIAGNOSIS — D75838 Other thrombocytosis: Secondary | ICD-10-CM | POA: Diagnosis present

## 2024-01-25 DIAGNOSIS — K5792 Diverticulitis of intestine, part unspecified, without perforation or abscess without bleeding: Secondary | ICD-10-CM

## 2024-01-25 DIAGNOSIS — H8110 Benign paroxysmal vertigo, unspecified ear: Secondary | ICD-10-CM | POA: Diagnosis present

## 2024-01-25 DIAGNOSIS — D62 Acute posthemorrhagic anemia: Secondary | ICD-10-CM | POA: Diagnosis present

## 2024-01-25 DIAGNOSIS — Z7982 Long term (current) use of aspirin: Secondary | ICD-10-CM | POA: Diagnosis not present

## 2024-01-25 DIAGNOSIS — K922 Gastrointestinal hemorrhage, unspecified: Secondary | ICD-10-CM | POA: Diagnosis present

## 2024-01-25 DIAGNOSIS — E441 Mild protein-calorie malnutrition: Secondary | ICD-10-CM | POA: Diagnosis present

## 2024-01-25 DIAGNOSIS — R933 Abnormal findings on diagnostic imaging of other parts of digestive tract: Secondary | ICD-10-CM

## 2024-01-25 DIAGNOSIS — Z96651 Presence of right artificial knee joint: Secondary | ICD-10-CM

## 2024-01-25 DIAGNOSIS — Z79899 Other long term (current) drug therapy: Secondary | ICD-10-CM | POA: Diagnosis not present

## 2024-01-25 DIAGNOSIS — K5732 Diverticulitis of large intestine without perforation or abscess without bleeding: Secondary | ICD-10-CM | POA: Diagnosis present

## 2024-01-25 DIAGNOSIS — K8020 Calculus of gallbladder without cholecystitis without obstruction: Secondary | ICD-10-CM | POA: Diagnosis present

## 2024-01-25 DIAGNOSIS — Z853 Personal history of malignant neoplasm of breast: Secondary | ICD-10-CM

## 2024-01-25 DIAGNOSIS — Z6831 Body mass index (BMI) 31.0-31.9, adult: Secondary | ICD-10-CM | POA: Diagnosis not present

## 2024-01-25 DIAGNOSIS — K625 Hemorrhage of anus and rectum: Principal | ICD-10-CM

## 2024-01-25 DIAGNOSIS — Z9889 Other specified postprocedural states: Secondary | ICD-10-CM

## 2024-01-25 LAB — COMPREHENSIVE METABOLIC PANEL WITH GFR
ALT: 32 U/L (ref 0–44)
AST: 22 U/L (ref 15–41)
Albumin: 3.1 g/dL — ABNORMAL LOW (ref 3.5–5.0)
Alkaline Phosphatase: 38 U/L (ref 38–126)
Anion gap: 5 (ref 5–15)
BUN: 25 mg/dL — ABNORMAL HIGH (ref 8–23)
CO2: 22 mmol/L (ref 22–32)
Calcium: 8.2 mg/dL — ABNORMAL LOW (ref 8.9–10.3)
Chloride: 108 mmol/L (ref 98–111)
Creatinine, Ser: 0.65 mg/dL (ref 0.44–1.00)
GFR, Estimated: >60 mL/min (ref >60)
Glucose, Bld: 110 mg/dL — ABNORMAL HIGH (ref 70–99)
Potassium: 4 mmol/L (ref 3.5–5.1)
Sodium: 135 mmol/L (ref 135–145)
Total Bilirubin: 0.4 mg/dL (ref 0.0–1.2)
Total Protein: 5.7 g/dL — ABNORMAL LOW (ref 6.5–8.1)

## 2024-01-25 LAB — POC OCCULT BLOOD, ED: Fecal Occult Bld: POSITIVE — AB

## 2024-01-25 LAB — CBC WITH DIFFERENTIAL/PLATELET
Abs Immature Granulocytes: 0.05 10*3/uL (ref 0.00–0.07)
Basophils Absolute: 0 10*3/uL (ref 0.0–0.1)
Basophils Relative: 0 %
Eosinophils Absolute: 0 10*3/uL (ref 0.0–0.5)
Eosinophils Relative: 0 %
HCT: 32.4 % — ABNORMAL LOW (ref 36.0–46.0)
Hemoglobin: 10 g/dL — ABNORMAL LOW (ref 12.0–15.0)
Immature Granulocytes: 1 %
Lymphocytes Relative: 14 %
Lymphs Abs: 1.3 10*3/uL (ref 0.7–4.0)
MCH: 29.7 pg (ref 26.0–34.0)
MCHC: 30.9 g/dL (ref 30.0–36.0)
MCV: 96.1 fL (ref 80.0–100.0)
Monocytes Absolute: 0.9 10*3/uL (ref 0.1–1.0)
Monocytes Relative: 10 %
Neutro Abs: 6.9 10*3/uL (ref 1.7–7.7)
Neutrophils Relative %: 75 %
Platelets: 420 10*3/uL — ABNORMAL HIGH (ref 150–400)
RBC: 3.37 MIL/uL — ABNORMAL LOW (ref 3.87–5.11)
RDW: 13.4 % (ref 11.5–15.5)
WBC: 9.2 10*3/uL (ref 4.0–10.5)
nRBC: 0 % (ref 0.0–0.2)

## 2024-01-25 LAB — TYPE AND SCREEN
ABO/RH(D): A NEG
Antibody Screen: NEGATIVE

## 2024-01-25 LAB — PROTIME-INR
INR: 1.1 (ref 0.8–1.2)
Prothrombin Time: 14 s (ref 11.4–15.2)

## 2024-01-25 LAB — ABO/RH: ABO/RH(D): A NEG

## 2024-01-25 LAB — MRSA NEXT GEN BY PCR, NASAL: MRSA by PCR Next Gen: NOT DETECTED

## 2024-01-25 MED ORDER — IOHEXOL 300 MG/ML  SOLN: 100.0000 mL | Freq: Once | INTRAMUSCULAR | Status: DC | PRN

## 2024-01-25 MED ORDER — MORPHINE SULFATE (PF) 2 MG/ML IV SOLN
2.0000 mg | Freq: Once | INTRAVENOUS | Status: AC
  Administered 2024-01-25: 2 mg via INTRAVENOUS

## 2024-01-25 MED ORDER — ACETAMINOPHEN 650 MG RE SUPP: 650.0000 mg | Freq: Four times a day (QID) | RECTAL | Status: DC | PRN

## 2024-01-25 MED ORDER — ONDANSETRON HCL 4 MG PO TABS: 4.0000 mg | ORAL_TABLET | Freq: Four times a day (QID) | ORAL | Status: DC | PRN

## 2024-01-25 MED ORDER — IOHEXOL 350 MG/ML SOLN
100.0000 mL | Freq: Once | INTRAVENOUS | Status: AC | PRN
  Administered 2024-01-25: 100 mL via INTRAVENOUS

## 2024-01-25 MED ORDER — PIPERACILLIN-TAZOBACTAM 3.375 G IVPB 30 MIN: 3.3750 g | Freq: Three times a day (TID) | INTRAVENOUS | Status: DC

## 2024-01-25 MED ORDER — MORPHINE SULFATE (PF) 4 MG/ML IV SOLN
4.0000 mg | INTRAVENOUS | Status: DC | PRN
  Administered 2024-01-25 – 2024-01-26 (×4): 4 mg via INTRAVENOUS
  Filled 2024-01-25 (×4): qty 1

## 2024-01-25 MED ORDER — ONDANSETRON HCL 4 MG/2ML IJ SOLN
4.0000 mg | Freq: Once | INTRAMUSCULAR | Status: AC
  Administered 2024-01-25: 4 mg via INTRAVENOUS
  Filled 2024-01-25: qty 2

## 2024-01-25 MED ORDER — CARMEX CLASSIC LIP BALM EX OINT
1.0000 | TOPICAL_OINTMENT | CUTANEOUS | Status: DC | PRN
  Administered 2024-01-26: 1 via TOPICAL
  Filled 2024-01-25: qty 10

## 2024-01-25 MED ORDER — PIPERACILLIN-TAZOBACTAM 3.375 G IVPB 30 MIN
3.3750 g | Freq: Once | INTRAVENOUS | Status: AC
  Administered 2024-01-25: 3.375 g via INTRAVENOUS
  Filled 2024-01-25: qty 50

## 2024-01-25 MED ORDER — ORAL CARE MOUTH RINSE: 15.0000 mL | OROMUCOSAL | Status: DC | PRN

## 2024-01-25 MED ORDER — ONDANSETRON HCL 4 MG/2ML IJ SOLN
4.0000 mg | Freq: Four times a day (QID) | INTRAMUSCULAR | Status: DC | PRN
  Administered 2024-01-25 (×2): 4 mg via INTRAVENOUS
  Filled 2024-01-25 (×2): qty 2

## 2024-01-25 MED ORDER — MORPHINE SULFATE (PF) 4 MG/ML IV SOLN
4.0000 mg | Freq: Once | INTRAVENOUS | Status: DC
  Filled 2024-01-25 (×2): qty 1

## 2024-01-25 MED ORDER — CHLORHEXIDINE GLUCONATE CLOTH 2 % EX PADS
6.0000 | MEDICATED_PAD | Freq: Every day | CUTANEOUS | Status: DC
  Administered 2024-01-25: 6 via TOPICAL

## 2024-01-25 MED ORDER — SODIUM CHLORIDE 0.9 % IV BOLUS
1000.0000 mL | Freq: Once | INTRAVENOUS | Status: AC
  Administered 2024-01-25: 1000 mL via INTRAVENOUS

## 2024-01-25 MED ORDER — PIPERACILLIN-TAZOBACTAM 3.375 G IVPB
3.3750 g | Freq: Three times a day (TID) | INTRAVENOUS | Status: DC
  Administered 2024-01-25 – 2024-01-27 (×5): 3.375 g via INTRAVENOUS
  Filled 2024-01-25 (×5): qty 50

## 2024-01-25 MED ORDER — ACETAMINOPHEN 325 MG PO TABS: 650.0000 mg | ORAL_TABLET | Freq: Four times a day (QID) | ORAL | Status: DC | PRN

## 2024-01-25 NOTE — ED Triage Notes (Signed)
 Pt c/o rectal bleeding starting at 5am. Pt states having lower abdominal pain and uncontrollable diarrhea. Pt states she had right knee replacement on 01/15/24. Pt taking aspirin.

## 2024-01-25 NOTE — ED Provider Notes (Signed)
 [place] EMERGENCY DEPARTMENT AT [facility] Provider Note   CSN: [id number] Arrival date & time: 01/25/24  4098     History  Chief Complaint  Patient presents with   Rectal Bleeding    Alyssa [name] is a 68 y.o. female.  Pt is a 68 yo female with pmhx significant for right breast cancer s/p lumpectomy, diverticulosis, and recent right knee replacement.  Pt woke up this am around 0500 with the feeling that she had to have a bowel movement.  Pt did have some diarrhea, but it was mainly bright red blood.  She had 2 other episodes of bleeding blood and called EMS.  She did have a near syncopal event in front of EMS and BP dropped to the 80s.  Pt denies any pain.  Pt did recently have a knee replacement, but has not been on any blood thinners.  She has been taking ASA.  Dr. Marina Goodell ([facility]) (05/2022) colonoscopy: Findings: - Many small and large- mouthed diverticula were found in the entire colon. Small internal hemorrhoids. - The exam was otherwise without abnormality on direct and retroflexion views.       Home Medications Prior to Admission medications   Medication Sig Start Date End Date Taking? Authorizing Provider  Calcium Citrate-Vitamin D (CALCIUM + D PO) daily.    [provider]  cholecalciferol (VITAMIN D) 400 UNITS TABS Take by mouth daily. Take 800 units daily    [provider]  Multiple Vitamin (MULTIVITAMIN ADULT) TABS 1 tablet    [provider]      Allergies    Other    Review of Systems   Review of Systems  Gastrointestinal:  Positive for blood in stool.  All other systems reviewed and are negative.   Physical Exam Updated Vital Signs BP 110/68   Pulse 76   Temp 97.6 F (36.4 C) (Oral)   Resp 12   Ht 5\' 4"  (1.626 m)   Wt 83.9 kg   SpO2 98%   BMI 31.76 kg/m  Physical Exam Vitals and nursing note reviewed.  Constitutional:      Appearance: Normal appearance. She is ill-appearing.  HENT:     Head:  Normocephalic and atraumatic.     Right Ear: External ear normal.     Left Ear: External ear normal.     Nose: Nose normal.     Mouth/Throat:     Mouth: Mucous membranes are dry.  Eyes:     Extraocular Movements: Extraocular movements intact.     Conjunctiva/sclera: Conjunctivae normal.     Pupils: Pupils are equal, round, and reactive to light.  Cardiovascular:     Rate and Rhythm: Normal rate and regular rhythm.     Pulses: Normal pulses.     Heart sounds: Normal heart sounds.  Pulmonary:     Effort: Pulmonary effort is normal.     Breath sounds: Normal breath sounds.  Abdominal:     General: Abdomen is flat. Bowel sounds are normal.     Palpations: Abdomen is soft.  Genitourinary:    Rectum: Guaiac result positive.  Musculoskeletal:        General: Normal range of motion.     Cervical back: Normal range of motion and neck supple.  Skin:    General: Skin is warm.     Capillary Refill: Capillary refill takes less than 2 seconds.  Neurological:     General: No focal deficit present.     Mental Status: She  is alert and oriented to person, place, and time.  Psychiatric:        Mood and Affect: Mood normal.        Behavior: Behavior normal.     ED Results / Procedures / Treatments   Labs (all labs ordered are listed, but only abnormal results are displayed) Labs Reviewed  COMPREHENSIVE METABOLIC PANEL WITH GFR - Abnormal; Notable for the following components:      Result Value   Glucose, Bld 110 (*)    BUN 25 (*)    Calcium 8.2 (*)    Total Protein 5.7 (*)    Albumin 3.1 (*)    All other components within normal limits  CBC WITH DIFFERENTIAL/PLATELET - Abnormal; Notable for the following components:   RBC 3.37 (*)    Hemoglobin 10.0 (*)    HCT 32.4 (*)    Platelets 420 (*)    All other components within normal limits  POC OCCULT BLOOD, ED - Abnormal; Notable for the following components:   Fecal Occult Bld POSITIVE (*)    All other components within normal limits   PROTIME-INR  TYPE AND SCREEN  ABO/RH    EKG EKG Interpretation Date/Time:  Saturday January 25 2024 09:23:37 EDT Ventricular Rate:  75 PR Interval:  160 QRS Duration:  76 QT Interval:  349 QTC Calculation: 390 R Axis:   49  Text Interpretation: Sinus rhythm Probable left atrial enlargement No significant change since last tracing Confirmed by Jacalyn Lefevre [id number]) on 01/25/2024 9:46:23 AM  Radiology CT Angio Abd/Pel W and/or Wo Contrast Result Date: 01/25/2024 CLINICAL DATA:  Rectal bleeding, abdominal pain and diarrhea. EXAM: CT ANGIOGRAPHY ABDOMEN AND PELVIS WITH CONTRAST AND WITHOUT CONTRAST TECHNIQUE: Multidetector CT imaging of the abdomen and pelvis was performed using the standard protocol during bolus administration of intravenous contrast. Multiplanar reconstructed images and MIPs were obtained and reviewed to evaluate the vascular anatomy. RADIATION DOSE REDUCTION: This exam was performed according to the departmental dose-optimization program which includes automated exposure control, adjustment of the mA and/or kV according to patient size and/or use of iterative reconstruction technique. CONTRAST:  OMNIPAQUE IOHEXOL 350 MG/ML SOLN COMPARISON:  None Available. FINDINGS: VASCULAR Aorta: Normal abdominal aorta without evidence atherosclerosis, aneurysm or dissection. Celiac: Normally patent.  Normally patent branch vessels. SMA: Normally patent.  Normal variant replaced right hepatic artery. Renals: Normally patent single right and paired left renal arteries. IMA: Normally patent. Inflow: Normally patent and tortuous iliac arteries. Proximal Outflow: Normally patent bilateral common femoral arteries and femoral bifurcations. Veins: Venous phase imaging demonstrates normally patent veins in the abdomen and pelvis with no anatomic variants. Review of the MIP images confirms the above findings. NON-VASCULAR Lower chest: No acute abnormality.  Small hiatal hernia. Hepatobiliary: Small  calcified gallstones in a mildly distended gallbladder without gallbladder inflammation or biliary ductal dilatation. Small benign hepatic cyst in the lateral caudate has a benign appearance and measures 10 mm. Pancreas: Fatty infiltration of the tail of the pancreas. No pancreatic ductal dilatation or surrounding inflammatory changes. Spleen: Normal in size without focal abnormality. Adrenals/Urinary Tract: Adrenal glands are unremarkable. Kidneys are normal, without renal calculi, focal lesion, or hydronephrosis. Bladder is unremarkable. Stomach/Bowel: Acute diverticulitis of the distal descending colon in the upper pelvis with segment colonic thickening and inflammation along the anterior aspect of the descending colon. The descending and sigmoid colon also demonstrate diffuse diverticulosis. No associated abscess or intraperitoneal free air. No active bleeding identified into the gastrointestinal tract on arterial or  venous phases of imaging. Bowel shows no evidence of obstruction or ileus. The appendix is normal. Lymphatic: No enlarged abdominal or pelvic lymph nodes. Reproductive: Normal appearance of the uterus. Cyst of the left adnexa measures approximately 3.2 x 2.8 x 3.5 cm and measures simple fluid density internally without visible solid component by CT. Other: No abdominal wall hernia or abnormality. No abdominopelvic ascites. Musculoskeletal: No acute or significant osseous findings. IMPRESSION: 1. Acute diverticulitis of the distal descending colon in the upper pelvis. No associated abscess or intraperitoneal free air. Due to focal colonic thickening secondary to presumed diverticulitis, follow-up colonoscopy would be indicated once diverticulitis is resolved to exclude underlying lesion. 2. No active gastrointestinal bleeding identified by CTA. 3. Cholelithiasis without evidence of acute cholecystitis. 4. 3.2 x 2.8 x 3.5 cm cyst of the left adnexa. Recommend follow-up US in 6-12 months. Note: This  recommendation does not apply to premenarchal patients and to those with increased risk (genetic, family history, elevated tumor markers or other high-risk factors) of ovarian cancer. Reference: JACR 2020 Feb; 17(2):248-254 5. Small hiatal hernia. 6. Normal appearance of the abdominal aorta and its branch vessels. Electronically Signed   By: Irish Lack M.D.   On: 01/25/2024 11:06    Procedures Procedures    Medications Ordered in ED Medications  piperacillin-tazobactam (ZOSYN) IVPB 3.375 g (has no administration in time range)  sodium chloride 0.9 % bolus 1,000 mL (0 mLs Intravenous Stopped 01/25/24 1105)  iohexol (OMNIPAQUE) 350 MG/ML injection 100 mL (100 mLs Intravenous Contrast Given 01/25/24 1013)  ondansetron (ZOFRAN) injection 4 mg (4 mg Intravenous Given 01/25/24 1119)  morphine (PF) 2 MG/ML injection 2 mg (2 mg Intravenous Given 01/25/24 1137)    ED Course/ Medical Decision Making/ A&P                                 Medical Decision Making Amount and/or Complexity of Data Reviewed Labs: ordered. Radiology: ordered.  Risk Prescription drug management. Decision regarding hospitalization.   This patient presents to the ED for concern of rectal bleeding, this involves an extensive number of treatment options, and is a complaint that carries with it a high risk of complications and morbidity.  The differential diagnosis includes diverticular bleed, internal hemorrhoid   Co morbidities that complicate the patient evaluation  right breast cancer s/p lumpectomy, diverticulosis, and recent right knee replacement   Additional history obtained:  Additional history obtained from epic chart review External records from outside source obtained and reviewed including EMS report   Lab Tests:  I Ordered, and personally interpreted labs.  The pertinent results include:  cbc with hgb 10, cmp nl other than bun elevated at 25; inr nl at 1.1.  No recent labs on file.   Imaging  Studies ordered:  I ordered imaging studies including cta abd/pelvis  I independently visualized and interpreted imaging which showed  Acute diverticulitis of the distal descending colon in the upper  pelvis. No associated abscess or intraperitoneal free air. Due to  focal colonic thickening secondary to presumed diverticulitis,  follow-up colonoscopy would be indicated once diverticulitis is  resolved to exclude underlying lesion.  2. No active gastrointestinal bleeding identified by CTA.  3. Cholelithiasis without evidence of acute cholecystitis.  4. 3.2 x 2.8 x 3.5 cm cyst of the left adnexa. Recommend follow-up  US in 6-12 months. Note: This recommendation does not apply to  premenarchal patients and to those with  increased risk (genetic,  family history, elevated tumor markers or other high-risk factors)  of ovarian cancer. Reference: JACR 2020 Feb; 17(2):248-254  5. Small hiatal hernia.  6. Normal appearance of the abdominal aorta and its branch vessels.   I agree with the radiologist interpretation   Cardiac Monitoring:  The patient was maintained on a cardiac monitor.  I personally viewed and interpreted the cardiac monitored which showed an underlying rhythm of: nsr   Medicines ordered and prescription drug management:  I ordered medication including ivfs  for sx  Reevaluation of the patient after these medicines showed that the patient improved I have reviewed the patients home medicines and have made adjustments as needed   Test Considered:  ct   Critical Interventions:  ivfs   Consultations Obtained:  I requested consultation with the gastroenterologists ([facility]),  and discussed lab and imaging findings as well as pertinent plan - they will see pt in consult. Pt d/w Dr. Robb Matar ([facility]) for admission.   Problem List / ED Course:  BRBPR:  ? Small diverticular bleed, but no active extravasation on CT.  Pt also has diverticulitis, so she is started on zosyn.  She  has had another bloody bowel movement while here, but bp has improved with ivfs.  [facility] will consult.  Pt remains npo. Cholelithiasis and ovarian cyst:  pt told of these incidental findings.     Reevaluation:  After the interventions noted above, I reevaluated the patient and found that they have :improved   Social Determinants of Health:  Lives at home   Dispostion:  After consideration of the diagnostic results and the patients response to treatment, I feel that the patent would benefit from admission.  CRITICAL CARE Performed by: Jacalyn Lefevre   Total critical care time: 30 minutes  Critical care time was exclusive of separately billable procedures and treating other patients.  Critical care was necessary to treat or prevent imminent or life-threatening deterioration.  Critical care was time spent personally by me on the following activities: development of treatment plan with patient and/or surrogate as well as nursing, discussions with consultants, evaluation of patient's response to treatment, examination of patient, obtaining history from patient or surrogate, ordering and performing treatments and interventions, ordering and review of laboratory studies, ordering and review of radiographic studies, pulse oximetry and re-evaluation of patient's condition.           Final Clinical Impression(s) / ED Diagnoses Final diagnoses:  Rectal bleeding  Acute diverticulitis  Calculus of gallbladder without cholecystitis without obstruction  Cyst of left ovary    Rx / DC Orders ED Discharge Orders     None         Jacalyn Lefevre, MD 01/25/24 1146

## 2024-01-25 NOTE — Consult Note (Signed)
 Inpatient Consultation   Referring Provider:     Dr. Jacalyn Lefevre Primary Care Physician:  Mila Palmer, MD Primary Gastroenterologist:       Dr. Yancey Flemings Reason for Consultation:     Hematochezia, symptomatic anemia, CT findings concerning for diverticulitis         HPI  Alyssa [name] is a 68 y.o. female with a past medical history noteworthy for breast cancer status postlumpectomy, cataracts, arthritis status post right TKA within the last 2 weeks who presents to the hospital with abrupt onset of hematochezia, symptomatic anemia and CT findings concerning for diverticulitis.  In speaking with Ms. Ragan as well as reviewing her prior medical records she reports that she underwent uncomplicated right knee arthroplasty approximately 2 weeks ago.  Postoperatively she was advised to take Celebrex daily as well as aspirin 81 mg p.o. twice daily.   On 01/20/2023 she states that she had a bloody bowel movement with red blood saturating the tissue.  Describes having abdominal cramping x 24 hours that day.  She contacted [facility] and was advised to discontinue Celebrex and decrease her aspirin to 81 mg p.o. every 24 hours.  She was given tramadol for pain control.  Bleeding and abdominal cramping resolved.  States she awoke at 5 AM on 01/25/2024 with a sense of needing to go to the bathroom and pressure in her abdomen.  Subsequently passed a large quantity of red and maroon stool in the toilet bowl.  Subsequently developed the onset of severe bilateral lower abdominal cramping that she described as feeling like menstrual cramps.  She had 2 more episodes of voluminous lower GI bleeding prompting call to EMS and transported to the hospital.  EMS records indicate blood pressure dropped to the 80s.  Notes that she felt lightheaded and dizzy sitting on the toilet at home.  In the ED, heart rate 70s blood pressure 110-122/60-70.  Labs: WBC 9.2, Hgb 10, HCT 32.4, PLT 420, PT  14, INR 1.1, normal CMP  At the time of my interview she is resting quietly on the hospital gurney in the ER in no distress.  States that her previous abdominal pain was transient and has now resolved.  No nausea or vomiting.  Has felt chilled but no fevers.  States that she had 1 more bloody bowel movement after presenting to the ED.  No prior history of lower GI bleeding.  No history of diverticulitis.  She has been started on IV Zosyn.  Last colonoscopy 05/22/2022-Dr. Marina Goodell Many small and large diverticuli throughout the entire colon; internal hemorrhoids  Past Medical History:  Diagnosis Date   Arthritis    fingers,back,knees   Cancer (HCC)    breast,nipple removed form left breast,right breast cancer   Cataract, immature    bilateral   Dental bridge present    upper and lower   Dental crowns present    6-7   Diverticulosis 2013   Severe Diverticulosis by colonoscopy    History of breast cancer    right   History of lumpectomy of right breast 2002   Microcalcifications of the breast 10/2014   left   Obesity    Radiculopathy, lumbar region     Past Surgical History:  Procedure Laterality Date   BRCA inconclusive     BREAST BIOPSY Left 11/18/2014   Procedure: LEFT NIPPLE REMOVAL;  Surgeon: Avel Peace, MD;  Location: [facility];  Service: General;  Laterality: Left;   BREAST LUMPECTOMY  Right    CARPAL TUNNEL RELEASE Right 05/27/2008   cataracts Bilateral    COLONOSCOPY     COLONOSCOPY WITH PROPOFOL  05/01/2012   DILATION AND CURETTAGE OF UTERUS     KNEE ARTHROSCOPY     x 3 - 2 on left, 1 on right   left meniscus tear repair     left nipple removal      LYMPH NODE DISSECTION     right carpal tunnel release     right meniscus tear repair     right shoulder surgery     bone chip removal, arthroscopic    SHOULDER ARTHROSCOPY Right 05/27/2008   TONSILLECTOMY     TUBAL LIGATION      Family History  Problem Relation Age of Onset   Colon polyps  Father    Heart disease Father    Colon cancer Neg Hx    Crohn's disease Neg Hx    Esophageal cancer Neg Hx    Rectal cancer Neg Hx    Stomach cancer Neg Hx     Social History   Tobacco Use   Smoking status: Never    Passive exposure: Past (as a child mom smoked)   Smokeless tobacco: Never  Vaping Use   Vaping status: Never Used  Substance Use Topics   Alcohol use: Yes    Comment: occasionally   Drug use: No    Prior to Admission medications   Medication Sig Start Date End Date Taking? Authorizing Provider  acetaminophen (TYLENOL) 500 MG tablet Take 1,000 mg by mouth every 6 (six) hours as needed.   Yes [provider]  aspirin EC 81 MG tablet Take 81 mg by mouth daily. Swallow whole.   Yes [provider]  Calcium Citrate-Vitamin D (CALCIUM + D PO) Take 1 tablet by mouth daily.   Yes [provider]  Multiple Vitamin (MULTIVITAMIN ADULT) TABS Take 1 tablet by mouth daily.   Yes [provider]  ondansetron (ZOFRAN) 4 MG tablet Take 4 mg by mouth every 6 (six) hours as needed. 01/08/24  Yes [provider]  tiZANidine (ZANAFLEX) 2 MG tablet Take 2 mg by mouth every 8 (eight) hours as needed. 01/06/24  Yes [provider]  traMADol (ULTRAM) 50 MG tablet Take 50 mg by mouth every 6 (six) hours as needed. 01/21/24  Yes [provider]  celecoxib (CELEBREX) 200 MG capsule Take 200 mg by mouth 2 (two) times daily. Patient not taking: Reported on 01/25/2024 01/06/24   [provider]  oxyCODONE (OXY IR/ROXICODONE) 5 MG immediate release tablet Take 5 mg by mouth every 4 (four) hours as needed. Patient not taking: Reported on 01/25/2024 01/06/24   [provider]    No current facility-administered medications for this encounter.   Current Outpatient Medications  Medication Sig Dispense Refill   acetaminophen (TYLENOL) 500 MG tablet Take 1,000 mg by mouth every 6 (six) hours as needed.     aspirin EC 81 MG  tablet Take 81 mg by mouth daily. Swallow whole.     Calcium Citrate-Vitamin D (CALCIUM + D PO) Take 1 tablet by mouth daily.     Multiple Vitamin (MULTIVITAMIN ADULT) TABS Take 1 tablet by mouth daily.     ondansetron (ZOFRAN) 4 MG tablet Take 4 mg by mouth every 6 (six) hours as needed.     tiZANidine (ZANAFLEX) 2 MG tablet Take 2 mg by mouth every 8 (eight) hours as needed.     traMADol Janean Sark)  50 MG tablet Take 50 mg by mouth every 6 (six) hours as needed.     celecoxib (CELEBREX) 200 MG capsule Take 200 mg by mouth 2 (two) times daily. (Patient not taking: Reported on 01/25/2024)     oxyCODONE (OXY IR/ROXICODONE) 5 MG immediate release tablet Take 5 mg by mouth every 4 (four) hours as needed. (Patient not taking: Reported on 01/25/2024)      Allergies as of 01/25/2024 - Review Complete 01/25/2024  Allergen Reaction Noted   Other Rash and Other (See Comments) 03/28/2021    GI Review of Symptoms Significant for abdominal cramping, hematochezia. Otherwise negative.  General Review of Systems  Review of systems is significant for the pertinent positives and negatives as listed per the HPI.  Full ROS is otherwise negative.    Physical Exam  Vital signs in last 24 hours: Temp:  [97.6 F (36.4 C)-98.3 F (36.8 C)] 98.3 F (36.8 C) (04/05 1242) Pulse Rate:  [73-78] 78 (04/05 1152) Resp:  [12-16] 16 (04/05 1152) BP: (110-122)/(68-78) 122/78 (04/05 1152) SpO2:  [97 %-99 %] 99 % (04/05 1152) Weight:  [83.9 kg] 83.9 kg (04/05 0854)   General:  NAD, Well developed, Well nourished, alert and cooperative Head:  Normocephalic and atraumatic. Eyes:   PEERL, EOMI. No icterus. Conjunctiva pink. Lungs: Respirations even and unlabored. Lungs clear to auscultation bilaterally.   No wheezes, crackles, or rhonchi.  Heart: Normal S1, S2. No MRG. Regular rate and rhythm. No peripheral edema, cyanosis or pallor.  Abdomen:  Soft, nondistended, nontender. No rebound or guarding. Normal bowel sounds. No  appreciable masses or hepatomegaly. Rectal:  Deferred  Extremities:  Right leg in bandages secondary to recent knee arthroplasty Skin:   Dry and intact without significant lesions or rashes.  Lab Results Recent Labs    01/25/24 0908  WBC 9.2  HGB 10.0*  HCT 32.4*  PLT 420*   BMET Recent Labs    01/25/24 0908  NA 135  K 4.0  CL 108  CO2 22  GLUCOSE 110*  BUN 25*  CREATININE 0.65  CALCIUM 8.2*   LFT Recent Labs    01/25/24 0908  PROT 5.7*  ALBUMIN 3.1*  AST 22  ALT 32  ALKPHOS 38  BILITOT 0.4   PT/INR Recent Labs    01/25/24 0908  LABPROT 14.0  INR 1.1    Radiographic Studies CT Angio Abd/Pel W and/or Wo Contrast Result Date: 01/25/2024 CLINICAL DATA:  Rectal bleeding, abdominal pain and diarrhea. EXAM: CT ANGIOGRAPHY ABDOMEN AND PELVIS WITH CONTRAST AND WITHOUT CONTRAST TECHNIQUE: Multidetector CT imaging of the abdomen and pelvis was performed using the standard protocol during bolus administration of intravenous contrast. Multiplanar reconstructed images and MIPs were obtained and reviewed to evaluate the vascular anatomy. RADIATION DOSE REDUCTION: This exam was performed according to the departmental dose-optimization program which includes automated exposure control, adjustment of the mA and/or kV according to patient size and/or use of iterative reconstruction technique. CONTRAST:  OMNIPAQUE IOHEXOL 350 MG/ML SOLN COMPARISON:  None Available. FINDINGS: VASCULAR Aorta: Normal abdominal aorta without evidence atherosclerosis, aneurysm or dissection. Celiac: Normally patent.  Normally patent branch vessels. SMA: Normally patent.  Normal variant replaced right hepatic artery. Renals: Normally patent single right and paired left renal arteries. IMA: Normally patent. Inflow: Normally patent and tortuous iliac arteries. Proximal Outflow: Normally patent bilateral common femoral arteries and femoral bifurcations. Veins: Venous phase imaging demonstrates normally  patent veins in the abdomen and pelvis with no anatomic variants. Review of the  MIP images confirms the above findings. NON-VASCULAR Lower chest: No acute abnormality.  Small hiatal hernia. Hepatobiliary: Small calcified gallstones in a mildly distended gallbladder without gallbladder inflammation or biliary ductal dilatation. Small benign hepatic cyst in the lateral caudate has a benign appearance and measures 10 mm. Pancreas: Fatty infiltration of the tail of the pancreas. No pancreatic ductal dilatation or surrounding inflammatory changes. Spleen: Normal in size without focal abnormality. Adrenals/Urinary Tract: Adrenal glands are unremarkable. Kidneys are normal, without renal calculi, focal lesion, or hydronephrosis. Bladder is unremarkable. Stomach/Bowel: Acute diverticulitis of the distal descending colon in the upper pelvis with segment colonic thickening and inflammation along the anterior aspect of the descending colon. The descending and sigmoid colon also demonstrate diffuse diverticulosis. No associated abscess or intraperitoneal free air. No active bleeding identified into the gastrointestinal tract on arterial or venous phases of imaging. Bowel shows no evidence of obstruction or ileus. The appendix is normal. Lymphatic: No enlarged abdominal or pelvic lymph nodes. Reproductive: Normal appearance of the uterus. Cyst of the left adnexa measures approximately 3.2 x 2.8 x 3.5 cm and measures simple fluid density internally without visible solid component by CT. Other: No abdominal wall hernia or abnormality. No abdominopelvic ascites. Musculoskeletal: No acute or significant osseous findings. IMPRESSION: 1. Acute diverticulitis of the distal descending colon in the upper pelvis. No associated abscess or intraperitoneal free air. Due to focal colonic thickening secondary to presumed diverticulitis, follow-up colonoscopy would be indicated once diverticulitis is resolved to exclude underlying lesion. 2. No  active gastrointestinal bleeding identified by CTA. 3. Cholelithiasis without evidence of acute cholecystitis. 4. 3.2 x 2.8 x 3.5 cm cyst of the left adnexa. Recommend follow-up US in 6-12 months. Note: This recommendation does not apply to premenarchal patients and to those with increased risk (genetic, family history, elevated tumor markers or other high-risk factors) of ovarian cancer. Reference: JACR 2020 Feb; 17(2):248-254 5. Small hiatal hernia. 6. Normal appearance of the abdominal aorta and its branch vessels. Electronically Signed   By: Irish Lack M.D.   On: 01/25/2024 11:06    Endoscopic Studies     Colonoscopy 05/22/2022 Many small and large diverticuli throughout the entire colon; internal hemorrhoids  Colonoscopy 05/01/2012 Severe diverticulosis in the left and ascending colon, otherwise normal  Colonoscopy 08/18/2001 Diverticulosis throughout the entire colon, internal hemorrhoids   Clinical Impression   It is my clinical impression that Ms. Alyssa [name] is a 68 year old woman with a past medical history noteworthy for breast cancer status postlumpectomy, cataracts, arthritis status post right TKA within the last 2 weeks who presents to the hospital with abrupt onset of hematochezia, symptomatic anemia and CT findings concerning for diverticulitis.  Her clinical picture is most consistent with lower GI bleeding secondary to diverticuli given the abrupt onset, voluminous bleeding and endoscopic history of multiple diverticula.  She manifested transient hypotension before EMS transport to the hospital but is now hemodynamically stable.  Hemoglobin on initial check is 10.  She is unsure of her baseline hemoglobin.  Noted that her hemoglobin could have been a little low even before her bleeding started because of her recent orthopedic surgery.  It is not clear to me that she truly has diverticulitis as suggested by her abdominal imaging.  The pain she describes was transient and could be  related to mild ischemia in the setting of abrupt decline in blood volume.  She has been started on IV Zosyn which is fine to continue for now as her progress is being monitored.  We discussed the nature of GI bleeding and general principles of conservative management.  Advised that the vast majority of diverticular bleeds ceased on their own.  Reviewed threshold for blood transfusion and additional interventions.   Plan  Monitor serial H&H every 6-8 hours Transfuse to maintain hemoglobin greater than 8 Monitor abdominal exam and hemodynamics Continue IV Zosyn Hold aspirin for now Okay for clear liquid diet while lower GI bleeding is being assessed and monitored.  Thank you for your kind consultation, we will continue to follow.  Durene Romans Keatyn Luck  01/25/2024, 12:52 PM  Maren Beach, MD [facility]

## 2024-01-25 NOTE — Plan of Care (Signed)

## 2024-01-25 NOTE — ED Notes (Addendum)
 Pt requested ice pack for right knee. Bedside commode in room.

## 2024-01-25 NOTE — H&P (Signed)
 History and Physical    Patient: Alyssa [name] ZOX:[id number] DOB: 1955-12-13 DOA: 01/25/2024 DOS: the patient was seen and examined on 01/25/2024 PCP: Mila Palmer, MD  Patient coming from: Home  Chief Complaint:  Chief Complaint  Patient presents with   Rectal Bleeding   HPI: Alyssa [name] is a 68 y.o. female with medical history significant of osteoarthritis, breast cancer, bilateral cataracts, diverticulosis, history of breast cancer, history of lumpectomy, obesity, lumbar radiculopathy, status post right TKA on 01/15/2024 who presented to the emergency department with rectal bleeding.  She has been taking aspirin since the procedure.  She was also taking Celebrex 5 days after the procedure she had an episode of hematochezia, which she noticed while she was wiping after a bowel movement.  She did not have any other incidents after this.  She was told to hold celecoxib after this and had not had any other issues since then.  However, she stated that earlier this morning she had a bowel movement with a significant amount of maroon blood that was followed by several other bowel movement in which the blood became redder after each subsequent BM.  This is associated with colicky LLQ pain that with a pressure sensation.  He also felt lightheadedness, but after that her last bowel movement became really dizzy and her SBP was in the 80s when EMS arrived.  She was also diaphoretic.  She denied fever, chills, rhinorrhea, sore throat, wheezing or hemoptysis.  No chest pain, palpitations, PND, orthopnea or pitting edema of the lower extremities. No flank pain, dysuria, frequency or hematuria.  No polyuria, polydipsia, polyphagia or blurred vision.   Lab work: CBC showed a white count of 9.2 with 75% neutrophils, hemoglobin 10.0 g/dL and platelets 811.  Fecal occult blood was positive.  PT was 14.0 and INR 1.1.  CMP showed normal electrolytes after calcium correction.  Glucose 110, BUN 25 and  creatinine 0.65 mg/dL.  Total protein was 5.7 and albumin 3.1 g/dL with the rest of the LFTs being normal.  Imaging: CTA abdomen/pelvis with no gastrointestinal bleeding identified by CTA.  There was acute diverticulitis in the distal descending colon in the upper pelvis.  No associated abscess or intraperitoneal free air.  Follow-up colonoscopy indicated once diverticulitis resolved.  There is cholelithiasis without evidence of acute cholecystitis.  3.2 x 2.8 x 3.5 cm cyst of the left adnexa with ultrasound recommended in 6 to 12 months.  Small hiatal hernia.  Normal appearance of the abdominal aorta and its branch vessels.   ED course: Initial vital signs were temperature 97.6 F, pulse 73, respirations 16, BP 119/72 mmHg O2 sat 97% on room air.  The patient received morphine 2 mg IV x 1, normal saline 1000 mL bolus and Zosyn 3.375 g IVPB.  Review of Systems: As mentioned in the history of present illness. All other systems reviewed and are negative. Past Medical History:  Diagnosis Date   Arthritis    fingers,back,knees   Cancer (HCC)    breast,nipple removed form left breast,right breast cancer   Cataract, immature    bilateral   Dental bridge present    upper and lower   Dental crowns present    6-7   Diverticulosis 2013   Severe Diverticulosis by colonoscopy    History of breast cancer    right   History of lumpectomy of right breast 2002   Microcalcifications of the breast 10/2014   left   Obesity    Radiculopathy, lumbar region  Past Surgical History:  Procedure Laterality Date   BRCA inconclusive     BREAST BIOPSY Left 11/18/2014   Procedure: LEFT NIPPLE REMOVAL;  Surgeon: Avel Peace, MD;  Location: [facility];  Service: General;  Laterality: Left;   BREAST LUMPECTOMY Right    CARPAL TUNNEL RELEASE Right 05/27/2008   cataracts Bilateral    COLONOSCOPY     COLONOSCOPY WITH PROPOFOL  05/01/2012   DILATION AND CURETTAGE OF UTERUS     KNEE  ARTHROSCOPY     x 3 - 2 on left, 1 on right   left meniscus tear repair     left nipple removal      LYMPH NODE DISSECTION     right carpal tunnel release     right meniscus tear repair     right shoulder surgery     bone chip removal, arthroscopic    SHOULDER ARTHROSCOPY Right 05/27/2008   TONSILLECTOMY     TUBAL LIGATION     Social History:  reports that she has never smoked. She has been exposed to tobacco smoke. She has never used smokeless tobacco. She reports current alcohol use. She reports that she does not use drugs.  Allergies  Allergen Reactions   Other Rash and Other (See Comments)    Back injection-Steroid Rash and Redness    Family History  Problem Relation Age of Onset   Colon polyps Father    Heart disease Father    Colon cancer Neg Hx    Crohn's disease Neg Hx    Esophageal cancer Neg Hx    Rectal cancer Neg Hx    Stomach cancer Neg Hx     Prior to Admission medications   Medication Sig Start Date End Date Taking? Authorizing Provider  Calcium Citrate-Vitamin D (CALCIUM + D PO) daily.    [provider]  cholecalciferol (VITAMIN D) 400 UNITS TABS Take by mouth daily. Take 800 units daily    [provider]  Multiple Vitamin (MULTIVITAMIN ADULT) TABS 1 tablet    [provider]    Physical Exam: Vitals:   01/25/24 0854 01/25/24 0857 01/25/24 0900  BP:  119/72 110/68  Pulse:  73 76  Resp:  16 12  Temp:  97.6 F (36.4 C)   TempSrc:  Oral   SpO2:  97% 98%  Weight: 83.9 kg    Height: 5\' 4"  (1.626 m)     Physical Exam Vitals and nursing note reviewed.  Constitutional:      General: She is awake. She is not in acute distress.    Appearance: Normal appearance. She is ill-appearing.  HENT:     Head: Normocephalic.     Nose: No rhinorrhea.     Mouth/Throat:     Mouth: Mucous membranes are moist.  Eyes:     General: No scleral icterus.    Pupils: Pupils are equal, round, and reactive to light.  Neck:     Vascular: No  JVD.  Cardiovascular:     Rate and Rhythm: Normal rate and regular rhythm.     Heart sounds: S1 normal and S2 normal.  Pulmonary:     Effort: Pulmonary effort is normal.     Breath sounds: Normal breath sounds. No wheezing, rhonchi or rales.  Abdominal:     General: There is no distension.     Palpations: Abdomen is soft.     Tenderness: There is abdominal tenderness. There is no right CVA tenderness, left CVA tenderness, guarding or rebound.  Musculoskeletal:     Cervical back: Neck supple.     Right knee: Swelling present. Decreased range of motion.     Right lower leg: No edema.     Left lower leg: No edema.  Skin:    General: Skin is warm and dry.  Neurological:     General: No focal deficit present.     Mental Status: She is alert and oriented to person, place, and time.  Psychiatric:        Mood and Affect: Mood normal.        Behavior: Behavior normal. Behavior is cooperative.     Data Reviewed:  Results are pending, will review when available.  Assessment and Plan: Principal Problem:   ABLA (acute blood loss anemia)  Secondary to:   Lower GI bleed In the setting of:   Diverticulitis large intestine  Admit to stepdown/inpatient. Keep NPO for now. Hold aspirin. Continue IV fluids. Keep n.p.o. for now. Analgesics as needed. Antiemetics as needed. Monitor H&H. Transfuse as needed. Continue Zosyn every 8 hours. GI consult  appreciated.  Active Problems:   BPPV (benign paroxysmal positional vertigo) Meclizine 25 mg p.o. as needed.    Thrombocytosis Secondary to anemia.    Hyperglycemia Check fasting glucose in AM.    Mild protein malnutrition (HCC) In the setting of ABLA. May benefit from protein supplementation. Consider nutritional services evaluation. Follow-up albumin level.      S/P knee surgery  Continue analgesics as needed.    Advance Care Planning:   Code Status: Full Code   Consults:   Family Communication: Her husband was at  bedside.  Severity of Illness: The appropriate patient status for this patient is INPATIENT. Inpatient status is judged to be reasonable and necessary in order to provide the required intensity of service to ensure the patient's safety. The patient's presenting symptoms, physical exam findings, and initial radiographic and laboratory data in the context of their chronic comorbidities is felt to place them at high risk for further clinical deterioration. Furthermore, it is not anticipated that the patient will be medically stable for discharge from the hospital within 2 midnights of admission.   * I certify that at the point of admission it is my clinical judgment that the patient will require inpatient hospital care spanning beyond 2 midnights from the point of admission due to high intensity of service, high risk for further deterioration and high frequency of surveillance required.*  Author: Bobette Mo, MD 01/25/2024 11:31 AM  For on call review [email or web address].   This document was prepared using Dragon voice recognition software and may contain some unintended transcription errors.

## 2024-01-26 DIAGNOSIS — K922 Gastrointestinal hemorrhage, unspecified: Secondary | ICD-10-CM

## 2024-01-26 DIAGNOSIS — R933 Abnormal findings on diagnostic imaging of other parts of digestive tract: Secondary | ICD-10-CM | POA: Diagnosis not present

## 2024-01-26 DIAGNOSIS — K921 Melena: Secondary | ICD-10-CM | POA: Diagnosis not present

## 2024-01-26 DIAGNOSIS — D649 Anemia, unspecified: Secondary | ICD-10-CM | POA: Diagnosis not present

## 2024-01-26 LAB — COMPREHENSIVE METABOLIC PANEL WITH GFR
ALT: 31 U/L (ref 0–44)
AST: 21 U/L (ref 15–41)
Albumin: 3.1 g/dL — ABNORMAL LOW (ref 3.5–5.0)
Alkaline Phosphatase: 40 U/L (ref 38–126)
Anion gap: 8 (ref 5–15)
BUN: 21 mg/dL (ref 8–23)
CO2: 21 mmol/L — ABNORMAL LOW (ref 22–32)
Calcium: 8.4 mg/dL — ABNORMAL LOW (ref 8.9–10.3)
Chloride: 107 mmol/L (ref 98–111)
Creatinine, Ser: 0.58 mg/dL (ref 0.44–1.00)
GFR, Estimated: >60 mL/min (ref >60)
Glucose, Bld: 89 mg/dL (ref 70–99)
Potassium: 4.2 mmol/L (ref 3.5–5.1)
Sodium: 136 mmol/L (ref 135–145)
Total Bilirubin: 0.7 mg/dL (ref 0.0–1.2)
Total Protein: 5.9 g/dL — ABNORMAL LOW (ref 6.5–8.1)

## 2024-01-26 LAB — HIV ANTIBODY (ROUTINE TESTING W REFLEX): HIV Screen 4th Generation wRfx: NONREACTIVE

## 2024-01-26 LAB — CBC
HCT: 31.8 % — ABNORMAL LOW (ref 36.0–46.0)
Hemoglobin: 9.8 g/dL — ABNORMAL LOW (ref 12.0–15.0)
MCH: 30.3 pg (ref 26.0–34.0)
MCHC: 30.8 g/dL (ref 30.0–36.0)
MCV: 98.5 fL (ref 80.0–100.0)
Platelets: 406 10*3/uL — ABNORMAL HIGH (ref 150–400)
RBC: 3.23 MIL/uL — ABNORMAL LOW (ref 3.87–5.11)
RDW: 13.7 % (ref 11.5–15.5)
WBC: 6.8 10*3/uL (ref 4.0–10.5)
nRBC: 0 % (ref 0.0–0.2)

## 2024-01-26 MED ORDER — MELATONIN 5 MG PO TABS
5.0000 mg | ORAL_TABLET | Freq: Every evening | ORAL | Status: DC | PRN
  Administered 2024-01-26: 5 mg via ORAL
  Filled 2024-01-26: qty 1

## 2024-01-26 MED ORDER — SODIUM CHLORIDE 0.9 % IV SOLN
INTRAVENOUS | Status: DC | PRN
Start: 2024-01-26 — End: 2024-01-27

## 2024-01-26 MED ORDER — ACETAMINOPHEN 500 MG PO TABS
1000.0000 mg | ORAL_TABLET | Freq: Four times a day (QID) | ORAL | Status: DC
  Administered 2024-01-26 – 2024-01-27 (×4): 1000 mg via ORAL
  Filled 2024-01-26 (×5): qty 2

## 2024-01-26 MED ORDER — TIZANIDINE HCL 4 MG PO TABS
2.0000 mg | ORAL_TABLET | Freq: Three times a day (TID) | ORAL | Status: DC | PRN
Start: 2024-01-26 — End: 2024-01-27
  Administered 2024-01-26 – 2024-01-27 (×2): 2 mg via ORAL
  Filled 2024-01-26 (×2): qty 1

## 2024-01-26 MED ORDER — TRAMADOL HCL 50 MG PO TABS
50.0000 mg | ORAL_TABLET | Freq: Four times a day (QID) | ORAL | Status: DC | PRN
  Administered 2024-01-26 – 2024-01-27 (×4): 50 mg via ORAL
  Filled 2024-01-26 (×5): qty 1

## 2024-01-26 NOTE — Plan of Care (Signed)
  Problem: Education: Goal: Knowledge of General Education information will improve Description: Including pain rating scale, medication(s)/side effects and non-pharmacologic comfort measures Outcome: Progressing   Problem: Clinical Measurements: Goal: Respiratory complications will improve Outcome: Progressing Goal: Cardiovascular complication will be avoided Outcome: Progressing   Problem: Activity: Goal: Risk for activity intolerance will decrease Outcome: Progressing   Problem: Coping: Goal: Level of anxiety will decrease Outcome: Progressing   Problem: Elimination: Goal: Will not experience complications related to urinary retention Outcome: Progressing   Problem: Nutrition: Goal: Adequate nutrition will be maintained Outcome: Not Progressing

## 2024-01-26 NOTE — Progress Notes (Signed)
 PROGRESS NOTE    Alyssa [name]  ZOX:[id number] DOB: June 01, 1956 DOA: 01/25/2024 PCP: Mila Palmer, MD    Brief Narrative:   Alyssa [name] is a 68 y.o. female with medical history significant of osteoarthritis, breast cancer, bilateral cataracts, diverticulosis, history of breast cancer, history of lumpectomy, obesity, lumbar radiculopathy, status post right TKA on 01/15/2024 who presented to the emergency department with rectal bleeding.  She has been taking aspirin since the procedure.  She was also taking Celebrex 5 days after the procedure she had an episode of hematochezia, which she noticed while she was wiping after a bowel movement.  She did not have any other incidents after this.  She was told to hold celecoxib after this and had not had any other issues since then.  However, she stated that earlier this morning she had a bowel movement with a significant amount of maroon blood that was followed by several other bowel movement in which the blood became redder after each subsequent BM.  This is associated with colicky LLQ pain that with a pressure sensation.  He also felt lightheadedness, but after that her last bowel movement became really dizzy and her SBP was in the 80s when EMS arrived   Assessment and Plan:  ABLA (acute blood loss anemia)  Secondary to:   Lower GI bleed In the setting of:   Diverticulitis large intestine  Hold aspirin. - Advance diet as tolerated Monitor H&H. Continue Zosyn every 8 hours. GI consult  appreciated.      BPPV (benign paroxysmal positional vertigo) Meclizine 25 mg p.o. as needed.     Thrombocytosis Secondary to anemia.      Mild protein malnutrition (HCC) -recent surgery      S/P knee surgery  -resume home meds   Will plan to transfer to MedSurg floor with anticipation of discharge in the next 24 to 48 hours    DVT prophylaxis: SCDs Start: 01/25/24 1250    Code Status: Full Code Family Communication: At  bedside  Disposition Plan:  Level of care: Stepdown Status is: Inpatient     Consultants:  GI    Subjective: Tolerated clear diet well this morning Did well with physical therapy  Objective: Vitals:   01/26/24 0600 01/26/24 0700 01/26/24 0720 01/26/24 0800  BP: 114/62 119/67  (!) 115/51  Pulse: 86 85  85  Resp: 14 11  (!) 5  Temp:   97.8 F (36.6 C)   TempSrc:   Oral   SpO2: 96% 95%  97%  Weight:      Height:        Intake/Output Summary (Last 24 hours) at 01/26/2024 1113 Last data filed at 01/26/2024 0934 Gross per 24 hour  Intake 149.94 ml  Output --  Net 149.94 ml   Filed Weights   01/25/24 0854 01/25/24 1508  Weight: 83.9 kg 87.6 kg    Examination:   General: Appearance:    Obese female in no acute distress     Lungs:     respirations unlabored  Heart:    Normal heart rate.   MS:   All extremities are intact.    Neurologic:   Awake, alert       Data Reviewed: I have personally reviewed following labs and imaging studies  CBC: Recent Labs  Lab 01/25/24 0908 01/26/24 0253  WBC 9.2 6.8  NEUTROABS 6.9  --   HGB 10.0* 9.8*  HCT 32.4* 31.8*  MCV 96.1 98.5  PLT 420* 406*  Basic Metabolic Panel: Recent Labs  Lab 01/25/24 0908 01/26/24 0253  NA 135 136  K 4.0 4.2  CL 108 107  CO2 22 21*  GLUCOSE 110* 89  BUN 25* 21  CREATININE 0.65 0.58  CALCIUM 8.2* 8.4*   GFR: Estimated Creatinine Clearance: 73.1 mL/min (by C-G formula based on SCr of 0.58 mg/dL). Liver Function Tests: Recent Labs  Lab 01/25/24 0908 01/26/24 0253  AST 22 21  ALT 32 31  ALKPHOS 38 40  BILITOT 0.4 0.7  PROT 5.7* 5.9*  ALBUMIN 3.1* 3.1*   No results for input(s): "LIPASE", "AMYLASE" in the last 168 hours. No results for input(s): "AMMONIA" in the last 168 hours. Coagulation Profile: Recent Labs  Lab 01/25/24 0908  INR 1.1   Cardiac Enzymes: No results for input(s): "CKTOTAL", "CKMB", "CKMBINDEX", "TROPONINI" in the last 168 hours. BNP (last 3  results) No results for input(s): "PROBNP" in the last 8760 hours. HbA1C: No results for input(s): "HGBA1C" in the last 72 hours. CBG: No results for input(s): "GLUCAP" in the last 168 hours. Lipid Profile: No results for input(s): "CHOL", "HDL", "LDLCALC", "TRIG", "CHOLHDL", "LDLDIRECT" in the last 72 hours. Thyroid Function Tests: No results for input(s): "TSH", "T4TOTAL", "FREET4", "T3FREE", "THYROIDAB" in the last 72 hours. Anemia Panel: No results for input(s): "VITAMINB12", "FOLATE", "FERRITIN", "TIBC", "IRON", "RETICCTPCT" in the last 72 hours. Sepsis Labs: No results for input(s): "PROCALCITON", "LATICACIDVEN" in the last 168 hours.  Recent Results (from the past 240 hours)  MRSA Next Gen by PCR, Nasal     Status: None   Collection Time: 01/25/24  3:20 PM   Specimen: Nasal Mucosa; Nasal Swab  Result Value Ref Range Status   MRSA by PCR Next Gen NOT DETECTED NOT DETECTED Final    Comment: (NOTE) The GeneXpert MRSA Assay (FDA approved for NASAL specimens only), is one component of a comprehensive MRSA colonization surveillance program. It is not intended to diagnose MRSA infection nor to guide or monitor treatment for MRSA infections. Test performance is not FDA approved in patients less than 28 years old. Performed at [facility], [address] [place], [place] [address]          Radiology Studies: CT Angio Abd/Pel W and/or Wo Contrast Result Date: 01/25/2024 CLINICAL DATA:  Rectal bleeding, abdominal pain and diarrhea. EXAM: CT ANGIOGRAPHY ABDOMEN AND PELVIS WITH CONTRAST AND WITHOUT CONTRAST TECHNIQUE: Multidetector CT imaging of the abdomen and pelvis was performed using the standard protocol during bolus administration of intravenous contrast. Multiplanar reconstructed images and MIPs were obtained and reviewed to evaluate the vascular anatomy. RADIATION DOSE REDUCTION: This exam was performed according to the departmental dose-optimization  program which includes automated exposure control, adjustment of the mA and/or kV according to patient size and/or use of iterative reconstruction technique. CONTRAST:  OMNIPAQUE IOHEXOL 350 MG/ML SOLN COMPARISON:  None Available. FINDINGS: VASCULAR Aorta: Normal abdominal aorta without evidence atherosclerosis, aneurysm or dissection. Celiac: Normally patent.  Normally patent branch vessels. SMA: Normally patent.  Normal variant replaced right hepatic artery. Renals: Normally patent single right and paired left renal arteries. IMA: Normally patent. Inflow: Normally patent and tortuous iliac arteries. Proximal Outflow: Normally patent bilateral common femoral arteries and femoral bifurcations. Veins: Venous phase imaging demonstrates normally patent veins in the abdomen and pelvis with no anatomic variants. Review of the MIP images confirms the above findings. NON-VASCULAR Lower chest: No acute abnormality.  Small hiatal hernia. Hepatobiliary: Small calcified gallstones in a mildly distended gallbladder without gallbladder  inflammation or biliary ductal dilatation. Small benign hepatic cyst in the lateral caudate has a benign appearance and measures 10 mm. Pancreas: Fatty infiltration of the tail of the pancreas. No pancreatic ductal dilatation or surrounding inflammatory changes. Spleen: Normal in size without focal abnormality. Adrenals/Urinary Tract: Adrenal glands are unremarkable. Kidneys are normal, without renal calculi, focal lesion, or hydronephrosis. Bladder is unremarkable. Stomach/Bowel: Acute diverticulitis of the distal descending colon in the upper pelvis with segment colonic thickening and inflammation along the anterior aspect of the descending colon. The descending and sigmoid colon also demonstrate diffuse diverticulosis. No associated abscess or intraperitoneal free air. No active bleeding identified into the gastrointestinal tract on arterial or venous phases of imaging. Bowel shows no  evidence of obstruction or ileus. The appendix is normal. Lymphatic: No enlarged abdominal or pelvic lymph nodes. Reproductive: Normal appearance of the uterus. Cyst of the left adnexa measures approximately 3.2 x 2.8 x 3.5 cm and measures simple fluid density internally without visible solid component by CT. Other: No abdominal wall hernia or abnormality. No abdominopelvic ascites. Musculoskeletal: No acute or significant osseous findings. IMPRESSION: 1. Acute diverticulitis of the distal descending colon in the upper pelvis. No associated abscess or intraperitoneal free air. Due to focal colonic thickening secondary to presumed diverticulitis, follow-up colonoscopy would be indicated once diverticulitis is resolved to exclude underlying lesion. 2. No active gastrointestinal bleeding identified by CTA. 3. Cholelithiasis without evidence of acute cholecystitis. 4. 3.2 x 2.8 x 3.5 cm cyst of the left adnexa. Recommend follow-up US in 6-12 months. Note: This recommendation does not apply to premenarchal patients and to those with increased risk (genetic, family history, elevated tumor markers or other high-risk factors) of ovarian cancer. Reference: JACR 2020 Feb; 17(2):248-254 5. Small hiatal hernia. 6. Normal appearance of the abdominal aorta and its branch vessels. Electronically Signed   By: Irish Lack M.D.   On: 01/25/2024 11:06        Scheduled Meds:  acetaminophen  1,000 mg Oral QID   Chlorhexidine Gluconate Cloth  6 each Topical Daily   Continuous Infusions:  piperacillin-tazobactam (ZOSYN)  IV Stopped (01/26/24 0920)     LOS: 1 day    Time spent: 45 minutes spent on chart review, discussion with nursing staff, consultants, updating family and interview/physical exam; more than 50% of that time was spent in counseling and/or coordination of care.    Joseph Art, DO [facility] Available via Epic secure chat 7am-7pm After these hours, please refer to coverage provider  listed on [email or web address] 01/26/2024, 11:13 AM

## 2024-01-26 NOTE — Progress Notes (Signed)
 Inpatient Progress Note     Patient Profile/Chief Complaint  Alyssa [name] is a 68 year old woman with a past medical history noteworthy for breast cancer status postlumpectomy, cataracts, arthritis status post right TKA within the last 2 weeks who presents to the hospital with abrupt onset of hematochezia, symptomatic anemia  c/w diverticular bleed and CT findings concerning for diverticulitis.    Interval History   -- No acute events overnight -- Denies further GI bleeding since last stool in the emergency department for 6:25 AM -- No abdominal pain or cramping -- Hgb trend 10 --> 9.8   Objective   Vital signs in last 24 hours: Temp:  [97.3 F (36.3 C)-98.4 F (36.9 C)] 97.8 F (36.6 C) (04/06 0720) Pulse Rate:  [61-96] 85 (04/06 0800) Resp:  [5-26] 5 (04/06 0800) BP: (102-135)/(48-80) 115/51 (04/06 0800) SpO2:  [93 %-100 %] 97 % (04/06 0800) Weight:  [87.6 kg] 87.6 kg (04/05 1508) Last BM Date : 01/25/24 General:    Alert, resting in bed in no distress Heart:  Regular rate and rhythm; no murmurs Lungs: Respirations even and unlabored, lungs CTA bilaterally Abdomen:  Soft, nontender and nondistended. Normal bowel sounds. Extremities: Right leg in bandages secondary to knee replacement Neurologic:  Alert and oriented,  grossly normal neurologically. Psych:  Cooperative. Normal mood and affect.  Intake/Output from previous day: 04/05 0701 - 04/06 0700 In: 113.3 [IV Piggyback:113.3] Out: -  Intake/Output this shift: Total I/O In: 36.7 [IV Piggyback:36.7] Out: -   Lab Results: Recent Labs    01/25/24 0908 01/26/24 0253  WBC 9.2 6.8  HGB 10.0* 9.8*  HCT 32.4* 31.8*  PLT 420* 406*   BMET Recent Labs    01/25/24 0908 01/26/24 0253  NA 135 136  K 4.0 4.2  CL 108 107  CO2 22 21*  GLUCOSE 110* 89  BUN 25* 21  CREATININE 0.65 0.58  CALCIUM 8.2* 8.4*   LFT Recent Labs    01/26/24 0253  PROT 5.9*  ALBUMIN 3.1*  AST 21  ALT 31  ALKPHOS 40  BILITOT 0.7    PT/INR Recent Labs    01/25/24 0908  LABPROT 14.0  INR 1.1    Studies/Results: CT Angio Abd/Pel W and/or Wo Contrast Result Date: 01/25/2024 CLINICAL DATA:  Rectal bleeding, abdominal pain and diarrhea. EXAM: CT ANGIOGRAPHY ABDOMEN AND PELVIS WITH CONTRAST AND WITHOUT CONTRAST TECHNIQUE: Multidetector CT imaging of the abdomen and pelvis was performed using the standard protocol during bolus administration of intravenous contrast. Multiplanar reconstructed images and MIPs were obtained and reviewed to evaluate the vascular anatomy. RADIATION DOSE REDUCTION: This exam was performed according to the departmental dose-optimization program which includes automated exposure control, adjustment of the mA and/or kV according to patient size and/or use of iterative reconstruction technique. CONTRAST:  OMNIPAQUE IOHEXOL 350 MG/ML SOLN COMPARISON:  None Available. FINDINGS: VASCULAR Aorta: Normal abdominal aorta without evidence atherosclerosis, aneurysm or dissection. Celiac: Normally patent.  Normally patent branch vessels. SMA: Normally patent.  Normal variant replaced right hepatic artery. Renals: Normally patent single right and paired left renal arteries. IMA: Normally patent. Inflow: Normally patent and tortuous iliac arteries. Proximal Outflow: Normally patent bilateral common femoral arteries and femoral bifurcations. Veins: Venous phase imaging demonstrates normally patent veins in the abdomen and pelvis with no anatomic variants. Review of the MIP images confirms the above findings. NON-VASCULAR Lower chest: No acute abnormality.  Small hiatal hernia. Hepatobiliary: Small calcified gallstones in a mildly distended gallbladder without gallbladder inflammation or  biliary ductal dilatation. Small benign hepatic cyst in the lateral caudate has a benign appearance and measures 10 mm. Pancreas: Fatty infiltration of the tail of the pancreas. No pancreatic ductal dilatation or surrounding inflammatory  changes. Spleen: Normal in size without focal abnormality. Adrenals/Urinary Tract: Adrenal glands are unremarkable. Kidneys are normal, without renal calculi, focal lesion, or hydronephrosis. Bladder is unremarkable. Stomach/Bowel: Acute diverticulitis of the distal descending colon in the upper pelvis with segment colonic thickening and inflammation along the anterior aspect of the descending colon. The descending and sigmoid colon also demonstrate diffuse diverticulosis. No associated abscess or intraperitoneal free air. No active bleeding identified into the gastrointestinal tract on arterial or venous phases of imaging. Bowel shows no evidence of obstruction or ileus. The appendix is normal. Lymphatic: No enlarged abdominal or pelvic lymph nodes. Reproductive: Normal appearance of the uterus. Cyst of the left adnexa measures approximately 3.2 x 2.8 x 3.5 cm and measures simple fluid density internally without visible solid component by CT. Other: No abdominal wall hernia or abnormality. No abdominopelvic ascites. Musculoskeletal: No acute or significant osseous findings. IMPRESSION: 1. Acute diverticulitis of the distal descending colon in the upper pelvis. No associated abscess or intraperitoneal free air. Due to focal colonic thickening secondary to presumed diverticulitis, follow-up colonoscopy would be indicated once diverticulitis is resolved to exclude underlying lesion. 2. No active gastrointestinal bleeding identified by CTA. 3. Cholelithiasis without evidence of acute cholecystitis. 4. 3.2 x 2.8 x 3.5 cm cyst of the left adnexa. Recommend follow-up US in 6-12 months. Note: This recommendation does not apply to premenarchal patients and to those with increased risk (genetic, family history, elevated tumor markers or other high-risk factors) of ovarian cancer. Reference: JACR 2020 Feb; 17(2):248-254 5. Small hiatal hernia. 6. Normal appearance of the abdominal aorta and its branch vessels. Electronically  Signed   By: Irish Lack M.D.   On: 01/25/2024 11:06    Endoscopic Studies: Colonoscopy 05/22/2022 Many small and large diverticuli throughout the entire colon; internal hemorrhoids   Colonoscopy 05/01/2012 Severe diverticulosis in the left and ascending colon, otherwise normal   Colonoscopy 08/18/2001 Diverticulosis throughout the entire colon, internal hemorrhoids   Clinical Impression   It is my clinical impression that Alyssa [name] is a 68 year old woman with a past medical history noteworthy for breast cancer status postlumpectomy, cataracts, arthritis status post right TKA within the last 2 weeks who presents to the hospital with abrupt onset of hematochezia, symptomatic anemia and CT findings concerning for diverticulitis.   Her clinical picture is most consistent with lower GI bleeding secondary to diverticuli given the abrupt onset, voluminous bleeding and endoscopic history of multiple diverticula.  She manifested transient hypotension before EMS transport to the hospital but is now hemodynamically stable.    She has not had further overt bleeding since 01/25/24 AM.  Hemoglobin trend is reassuring 10 --> 9.8.  Denies abdominal pain or cramping.  It is still not clear to me that she truly has diverticulitis as suggested by her abdominal imaging.  The pain she describes was transient and could be related to mild ischemia in the setting of abrupt decline in blood volume.  She is feeling better on IV Zosyn which could also explain her improvement in abdominal pain.  Given CT findings we will presume that there could be a component of diverticulitis and can complete treatment     Plan  Monitor serial H&H every 8-12 hours Transfuse to maintain hemoglobin greater than 8 Monitor abdominal exam and hemodynamics Continue  IV Zosyn -if stable on 01/27/2024 can transition to p.o. Cipro and Flagyl to complete a 14-day course of antibiotics.  Advise follow-up colonoscopy minimum 6 - 8 weeks status  post hospitalization for assessment in the setting of possible diverticulitis.  She is recovering from knee replacement surgery and diagnosis of diverticulitis is somewhat questionable so this could be pushed out to a further date if needed. Hold aspirin for now -if no further bleeding over the next 24 hours anticipate this can be resumed on 01/27/2024 Advance to regular diet  Dr. Leone Payor will assume rounding responsibilities on 01/27/2024.    LOS: 1 day   Alyssa [name]  01/26/2024, 11:21 AM  Maren Beach, MD [facility]

## 2024-01-26 NOTE — Evaluation (Signed)
 Physical Therapy Evaluation Patient Details Name: Alyssa [name] MRN: [id number] DOB: 15-Aug-1956 Today's Date: 01/26/2024  History of Present Illness  Pt admitted from home 2* GIB in setting of diverticulitis and with hx of BPPV, Breast CA and R TKR 01/15/24.  Clinical Impression  Pt admitted as above and presenting with functional mobility limitations 2* decreased R LE strength/ROM, post op pain, and elevated HR with activity.  Pt should progress to dc home with family assist and hopes to resume ongoing OP PT to address recent TKR and associated deficits.          If plan is discharge home, recommend the following: A little help with walking and/or transfers;A little help with bathing/dressing/bathroom;Assistance with cooking/housework;Assist for transportation;Help with stairs or ramp for entrance   Can travel by private vehicle        Equipment Recommendations None recommended by PT  Recommendations for Other Services       Functional Status Assessment Patient has had a recent decline in their functional status and demonstrates the ability to make significant improvements in function in a reasonable and predictable amount of time.     Precautions / Restrictions Precautions Precautions: Knee;Fall Precaution/Restrictions Comments: Watch HR Restrictions Weight Bearing Restrictions Per Provider Order: No      Mobility  Bed Mobility Overal bed mobility: Needs Assistance Bed Mobility: Supine to Sit     Supine to sit: Supervision     General bed mobility comments: Increased time with Sup for safety only\    Transfers Overall transfer level: Needs assistance Equipment used: Rolling walker (2 wheels) Transfers: Sit to/from Stand Sit to Stand: Contact guard assist           General transfer comment: Steady assist only    Ambulation/Gait Ambulation/Gait assistance: Contact guard assist, Supervision Gait Distance (Feet): 150 Feet Assistive device: Rolling walker  (2 wheels) Gait Pattern/deviations: Decreased step length - right, Decreased step length - left, Shuffle, Trunk flexed, Step-to pattern, Step-through pattern Gait velocity: decr     General Gait Details: cues for posture and position from [employer]            Wheelchair Mobility     Tilt Bed    Modified Rankin (Stroke Patients Only)       Balance Overall balance assessment: Mild deficits observed, not formally tested                                           Pertinent Vitals/Pain Pain Assessment Pain Assessment: 0-10 Pain Score: 3  Pain Location: R knee Pain Descriptors / Indicators: Aching, Sore Pain Intervention(s): Limited activity within patient's tolerance, Monitored during session, Premedicated before session, Ice applied    Home Living Family/patient expects to be discharged to:: Private residence Living Arrangements: Spouse/significant other Available Help at Discharge: Family Type of Home: House Home Access: Stairs to enter   Secretary/administrator of Steps: 1   Home Layout: One level Home Equipment: Agricultural consultant (2 wheels);Cane - single point      Prior Function Prior Level of Function : Independent/Modified Independent             Mobility Comments: Walking on cane prior to admit       Extremity/Trunk Assessment   Upper Extremity Assessment Upper Extremity Assessment: Overall WFL for tasks assessed    Lower Extremity Assessment Lower Extremity Assessment: RLE deficits/detail RLE  Deficits / Details: IND SLR with AAROM at knee -5 - 75    Cervical / Trunk Assessment Cervical / Trunk Assessment: Normal  Communication   Communication Communication: No apparent difficulties    Cognition Arousal: Alert Behavior During Therapy: WFL for tasks assessed/performed   PT - Cognitive impairments: No apparent impairments                         Following commands: Intact       Cueing Cueing Techniques:  Verbal cues     General Comments      Exercises Total Joint Exercises Ankle Circles/Pumps: AROM, Both, 15 reps, Supine Quad Sets: AROM, Both, 10 reps, Supine Heel Slides: AAROM, Right, 15 reps, Supine Straight Leg Raises: AROM, Right, 15 reps, Supine Long Arc Quad: AAROM, Right, 15 reps, Seated   Assessment/Plan    PT Assessment Patient needs continued PT services  PT Problem List Decreased strength;Decreased range of motion;Decreased activity tolerance;Decreased balance;Decreased mobility;Decreased knowledge of use of DME;Pain       PT Treatment Interventions DME instruction;Gait training;Stair training;Functional mobility training;Therapeutic activities;Therapeutic exercise;Balance training;Patient/family education    PT Goals (Current goals can be found in the Care Plan section)  Acute Rehab PT Goals Patient Stated Goal: Regain IND PT Goal Formulation: With patient Time For Goal Achievement: 02/09/24 Potential to Achieve Goals: Good    Frequency Min 5X/week     Co-evaluation               AM-PAC PT "6 Clicks" Mobility  Outcome Measure Help needed turning from your back to your side while in a flat bed without using bedrails?: None Help needed moving from lying on your back to sitting on the side of a flat bed without using bedrails?: None Help needed moving to and from a bed to a chair (including a wheelchair)?: A Little Help needed standing up from a chair using your arms (e.g., wheelchair or bedside chair)?: A Little Help needed to walk in hospital room?: A Little Help needed climbing 3-5 steps with a railing? : A Little 6 Click Score: 20    End of Session Equipment Utilized During Treatment: Gait belt Activity Tolerance: Patient tolerated treatment well Patient left: in chair;with call bell/phone within reach Nurse Communication: Mobility status PT Visit Diagnosis: Difficulty in walking, not elsewhere classified (R26.2)    Time: 1308-6578 PT Time  Calculation (min) (ACUTE ONLY): 30 min   Charges:   PT Evaluation $PT Eval Low Complexity: 1 Low PT Treatments $Therapeutic Exercise: 8-22 mins PT General Charges $$ ACUTE PT VISIT: 1 Visit         Mauro Kaufmann PT Acute Rehabilitation Services Pager [phone] Office [phone]   Tyresse Jayson 01/26/2024, 11:57 AM

## 2024-01-26 NOTE — Plan of Care (Signed)

## 2024-01-27 ENCOUNTER — Other Ambulatory Visit (HOSPITAL_COMMUNITY): Payer: Self-pay

## 2024-01-27 ENCOUNTER — Telehealth (HOSPITAL_COMMUNITY): Payer: Self-pay | Admitting: Pharmacy Technician

## 2024-01-27 DIAGNOSIS — K922 Gastrointestinal hemorrhage, unspecified: Secondary | ICD-10-CM | POA: Diagnosis not present

## 2024-01-27 LAB — BASIC METABOLIC PANEL WITH GFR
Anion gap: 8 (ref 5–15)
BUN: 18 mg/dL (ref 8–23)
CO2: 24 mmol/L (ref 22–32)
Calcium: 8.8 mg/dL — ABNORMAL LOW (ref 8.9–10.3)
Chloride: 104 mmol/L (ref 98–111)
Creatinine, Ser: 0.64 mg/dL (ref 0.44–1.00)
GFR, Estimated: >60 mL/min (ref >60)
Glucose, Bld: 97 mg/dL (ref 70–99)
Potassium: 3.8 mmol/L (ref 3.5–5.1)
Sodium: 136 mmol/L (ref 135–145)

## 2024-01-27 LAB — CBC
HCT: 30.1 % — ABNORMAL LOW (ref 36.0–46.0)
Hemoglobin: 9.5 g/dL — ABNORMAL LOW (ref 12.0–15.0)
MCH: 30.2 pg (ref 26.0–34.0)
MCHC: 31.6 g/dL (ref 30.0–36.0)
MCV: 95.6 fL (ref 80.0–100.0)
Platelets: 420 10*3/uL — ABNORMAL HIGH (ref 150–400)
RBC: 3.15 MIL/uL — ABNORMAL LOW (ref 3.87–5.11)
RDW: 13.7 % (ref 11.5–15.5)
WBC: 7.1 10*3/uL (ref 4.0–10.5)
nRBC: 0 % (ref 0.0–0.2)

## 2024-01-27 MED ORDER — CIPROFLOXACIN HCL 500 MG PO TABS: 500.0000 mg | ORAL_TABLET | Freq: Two times a day (BID) | ORAL | 0 refills | Status: DC

## 2024-01-27 MED ORDER — BACLOFEN 5 MG PO TABS: 5.0000 mg | ORAL_TABLET | Freq: Three times a day (TID) | ORAL | 0 refills | Status: DC | PRN

## 2024-01-27 MED ORDER — METRONIDAZOLE 500 MG PO TABS: 500.0000 mg | ORAL_TABLET | Freq: Two times a day (BID) | ORAL | 0 refills | Status: DC

## 2024-01-27 MED ORDER — BACLOFEN 10 MG PO TABS: 10.0000 mg | ORAL_TABLET | Freq: Two times a day (BID) | ORAL | 0 refills | Status: DC | PRN

## 2024-01-27 MED ORDER — METRONIDAZOLE 500 MG PO TABS
500.0000 mg | ORAL_TABLET | Freq: Two times a day (BID) | ORAL | Status: DC
  Administered 2024-01-27: 500 mg via ORAL
  Filled 2024-01-27: qty 1

## 2024-01-27 MED ORDER — CIPROFLOXACIN HCL 500 MG PO TABS
500.0000 mg | ORAL_TABLET | Freq: Two times a day (BID) | ORAL | Status: DC
  Administered 2024-01-27: 500 mg via ORAL
  Filled 2024-01-27: qty 1

## 2024-01-27 NOTE — TOC Transition Note (Signed)
 Transition of Care [facility]) - Discharge Note   Patient Details  Name: Alyssa [name] MRN: [id number] Date of Birth: 13-Jul-1956  Transition of Care [facility] CM/SW Contact:  Howell Rucks, RN Phone Number: 01/27/2024, 12:00 PM   Clinical Narrative:   Met with patient at bedside to introduce role of TOC/NCM and review for dc planning, PT recommendation for OPPT, pt confirmed recent ortho surgery, already receiving OOPT twice weekly, pt has PCP in place, RW and cane at home. No TOC needs identified at this time.     Final next level of care: OP Rehab Barriers to Discharge: Barriers Resolved   Patient Goals and CMS Choice Patient states their goals for this hospitalization and ongoing recovery are:: return home          Discharge Placement                       Discharge Plan and Services Additional resources added to the After Visit Summary for                                       Social Drivers of Health (SDOH) Interventions SDOH Screenings   Food Insecurity: No Food Insecurity (01/25/2024)  Housing: Low Risk  (01/25/2024)  Transportation Needs: No Transportation Needs (01/25/2024)  Utilities: Not At Risk (01/25/2024)  Social Connections: Moderately Integrated (01/25/2024)  Tobacco Use: Low Risk  (01/25/2024)     Readmission Risk Interventions    01/27/2024   11:59 AM  Readmission Risk Prevention Plan  Post Dischage Appt Complete  Medication Screening Complete  Transportation Screening Complete

## 2024-01-27 NOTE — Progress Notes (Signed)
 AVS reviewed w/ pt who verbalized an understanding - ASA to be started in 24 hrs if pt has no rectal bleeding. PIV x 2 removed as noted. Pt dressing for d/c to home. Husband  enroute

## 2024-01-27 NOTE — Telephone Encounter (Signed)
 Patient Product/process development scientist completed.    The patient is insured through [employer]. Patient has Medicare and is not eligible for a copay card, but may be able to apply for patient assistance or Medicare RX Payment Plan (Patient Must reach out to their plan, if eligible for payment plan), if available.    Ran test claim for methocarbamol (Robaxin) 500 mg and Product not on formulary   This test claim was processed through [employer]- copay amounts may vary at other pharmacies due to [employer], or as the patient moves through the different stages of their insurance plan.     Roland Earl, CPHT Pharmacy Technician III Certified Patient Advocate [facility] Pharmacy Patient Advocate Team Direct Number: [phone]  Fax: [phone]

## 2024-01-27 NOTE — Plan of Care (Signed)
   Problem: Education: Goal: Knowledge of General Education information will improve Description Including pain rating scale, medication(s)/side effects and non-pharmacologic comfort measures Outcome: Progressing   Problem: Health Behavior/Discharge Planning: Goal: Ability to manage health-related needs will improve Outcome: Progressing

## 2024-01-27 NOTE — Discharge Summary (Signed)
 Physician Discharge Summary  [name] GNF:[id number] DOB: [birth year] DOA: 01/25/2024  PCP: Mila Palmer, MD  Admit date: 01/25/2024 Discharge date: 01/27/2024  Admitted From: home Discharge disposition: home   Recommendations for Outpatient Follow-Up:   Close ortho follow up Per GI:  transition to p.o. Cipro and Flagyl to complete a 14-day course of antibiotics.  Advise follow-up colonoscopy minimum 6 - 8 weeks status post hospitalization for assessment in the setting of possible diverticulitis.  She is recovering from knee replacement surgery and diagnosis of diverticulitis is somewhat questionable so this could be pushed out to a further date if needed.  Hold aspirin for now -if no further bleeding over the next 24 hours anticipate this can be resumed on 01/27/2024 Cbc 1 week 3.2 x 2.8 x 3.5 cm cyst of the left adnexa. Recommend follow-up US in 6-12 months   Discharge Diagnosis:   Principal Problem:   Lower GI bleed Active Problems:   BPPV (benign paroxysmal positional vertigo)   Normocytic anemia   Thrombocytosis   Hyperglycemia   Mild protein malnutrition (HCC)   S/P knee surgery   Diverticulitis large intestine   ABLA (acute blood loss anemia)    Discharge Condition: Improved.  Diet recommendation: Regular.  Wound care: None.  Code status: Full.   History of Present Illness:   Alyssa [name] is a 68 y.o. female with medical history significant of osteoarthritis, breast cancer, bilateral cataracts, diverticulosis, history of breast cancer, history of lumpectomy, obesity, lumbar radiculopathy, status post right TKA on 01/15/2024 who presented to the emergency department with rectal bleeding.  She has been taking aspirin since the procedure.  She was also taking Celebrex 5 days after the procedure she had an episode of hematochezia, which she noticed while she was wiping after a bowel movement.  She did not have any other incidents after this.  She was  told to hold celecoxib after this and had not had any other issues since then.  However, she stated that earlier this morning she had a bowel movement with a significant amount of maroon blood that was followed by several other bowel movement in which the blood became redder after each subsequent BM.  This is associated with colicky LLQ pain that with a pressure sensation.  He also felt lightheadedness, but after that her last bowel movement became really dizzy and her SBP was in the 80s when EMS arrived    Hospital Course by Problem:   ABLA (acute blood loss anemia)  Secondary to:   Lower GI bleed In the setting of:   Diverticulitis large intestine  GI consulted: transition to p.o. Cipro and Flagyl to complete a 14-day course of antibiotics.  Advise follow-up colonoscopy minimum 6 - 8 weeks status post hospitalization for assessment in the setting of possible diverticulitis.  She is recovering from knee replacement surgery and diagnosis of diverticulitis is somewhat questionable so this could be pushed out to a further date if needed.  Hold aspirin for now -if no further bleeding over the next 24 hours anticipate this can be resumed on 01/27/2024      Thrombocytosis Secondary to anemia.       Mild protein malnutrition (HCC) -recent surgery      S/P knee surgery  -resume home meds      Medical Consultants:   GI   Discharge Exam:   Vitals:   01/26/24 2114 01/27/24 0527  BP: 112/76 103/63  Pulse: 85 87  Resp: 16  16  Temp: 98.1 F (36.7 C) 97.8 F (36.6 C)  SpO2: 98% 98%   Vitals:   01/26/24 1528 01/26/24 1728 01/26/24 2114 01/27/24 0527  BP: 101/83 114/63 112/76 103/63  Pulse: 91 87 85 87  Resp: 16 16 16 16   Temp: 98.3 F (36.8 C) (!) 97.3 F (36.3 C) 98.1 F (36.7 C) 97.8 F (36.6 C)  TempSrc: Oral Oral Oral Oral  SpO2: 99% 97% 98% 98%  Weight:      Height:        General exam: Appears calm and comfortable.   The results of significant diagnostics from this  hospitalization (including imaging, microbiology, ancillary and laboratory) are listed below for reference.     Procedures and Diagnostic Studies:   CT Angio Abd/Pel W and/or Wo Contrast Result Date: 01/25/2024 CLINICAL DATA:  Rectal bleeding, abdominal pain and diarrhea. EXAM: CT ANGIOGRAPHY ABDOMEN AND PELVIS WITH CONTRAST AND WITHOUT CONTRAST TECHNIQUE: Multidetector CT imaging of the abdomen and pelvis was performed using the standard protocol during bolus administration of intravenous contrast. Multiplanar reconstructed images and MIPs were obtained and reviewed to evaluate the vascular anatomy. RADIATION DOSE REDUCTION: This exam was performed according to the departmental dose-optimization program which includes automated exposure control, adjustment of the mA and/or kV according to patient size and/or use of iterative reconstruction technique. CONTRAST:  OMNIPAQUE IOHEXOL 350 MG/ML SOLN COMPARISON:  None Available. FINDINGS: VASCULAR Aorta: Normal abdominal aorta without evidence atherosclerosis, aneurysm or dissection. Celiac: Normally patent.  Normally patent branch vessels. SMA: Normally patent.  Normal variant replaced right hepatic artery. Renals: Normally patent single right and paired left renal arteries. IMA: Normally patent. Inflow: Normally patent and tortuous iliac arteries. Proximal Outflow: Normally patent bilateral common femoral arteries and femoral bifurcations. Veins: Venous phase imaging demonstrates normally patent veins in the abdomen and pelvis with no anatomic variants. Review of the MIP images confirms the above findings. NON-VASCULAR Lower chest: No acute abnormality.  Small hiatal hernia. Hepatobiliary: Small calcified gallstones in a mildly distended gallbladder without gallbladder inflammation or biliary ductal dilatation. Small benign hepatic cyst in the lateral caudate has a benign appearance and measures 10 mm. Pancreas: Fatty infiltration of the tail of the pancreas.  No pancreatic ductal dilatation or surrounding inflammatory changes. Spleen: Normal in size without focal abnormality. Adrenals/Urinary Tract: Adrenal glands are unremarkable. Kidneys are normal, without renal calculi, focal lesion, or hydronephrosis. Bladder is unremarkable. Stomach/Bowel: Acute diverticulitis of the distal descending colon in the upper pelvis with segment colonic thickening and inflammation along the anterior aspect of the descending colon. The descending and sigmoid colon also demonstrate diffuse diverticulosis. No associated abscess or intraperitoneal free air. No active bleeding identified into the gastrointestinal tract on arterial or venous phases of imaging. Bowel shows no evidence of obstruction or ileus. The appendix is normal. Lymphatic: No enlarged abdominal or pelvic lymph nodes. Reproductive: Normal appearance of the uterus. Cyst of the left adnexa measures approximately 3.2 x 2.8 x 3.5 cm and measures simple fluid density internally without visible solid component by CT. Other: No abdominal wall hernia or abnormality. No abdominopelvic ascites. Musculoskeletal: No acute or significant osseous findings. IMPRESSION: 1. Acute diverticulitis of the distal descending colon in the upper pelvis. No associated abscess or intraperitoneal free air. Due to focal colonic thickening secondary to presumed diverticulitis, follow-up colonoscopy would be indicated once diverticulitis is resolved to exclude underlying lesion. 2. No active gastrointestinal bleeding identified by CTA. 3. Cholelithiasis without evidence of acute cholecystitis. 4. 3.2 x 2.8  x 3.5 cm cyst of the left adnexa. Recommend follow-up US in 6-12 months. Note: This recommendation does not apply to premenarchal patients and to those with increased risk (genetic, family history, elevated tumor markers or other high-risk factors) of ovarian cancer. Reference: JACR 2020 Feb; 17(2):248-254 5. Small hiatal hernia. 6. Normal appearance of  the abdominal aorta and its branch vessels. Electronically Signed   By: Irish Lack M.D.   On: 01/25/2024 11:06     Labs:   Basic Metabolic Panel: Recent Labs  Lab 01/25/24 0908 01/26/24 0253 01/27/24 0427  NA 135 136 136  K 4.0 4.2 3.8  CL 108 107 104  CO2 22 21* 24  GLUCOSE 110* 89 97  BUN 25* 21 18  CREATININE 0.65 0.58 0.64  CALCIUM 8.2* 8.4* 8.8*   GFR Estimated Creatinine Clearance: 73.1 mL/min (by C-G formula based on SCr of 0.64 mg/dL). Liver Function Tests: Recent Labs  Lab 01/25/24 0908 01/26/24 0253  AST 22 21  ALT 32 31  ALKPHOS 38 40  BILITOT 0.4 0.7  PROT 5.7* 5.9*  ALBUMIN 3.1* 3.1*   No results for input(s): "LIPASE", "AMYLASE" in the last 168 hours. No results for input(s): "AMMONIA" in the last 168 hours. Coagulation profile Recent Labs  Lab 01/25/24 0908  INR 1.1    CBC: Recent Labs  Lab 01/25/24 0908 01/26/24 0253 01/27/24 0427  WBC 9.2 6.8 7.1  NEUTROABS 6.9  --   --   HGB 10.0* 9.8* 9.5*  HCT 32.4* 31.8* 30.1*  MCV 96.1 98.5 95.6  PLT 420* 406* 420*   Cardiac Enzymes: No results for input(s): "CKTOTAL", "CKMB", "CKMBINDEX", "TROPONINI" in the last 168 hours. BNP: Invalid input(s): "POCBNP" CBG: No results for input(s): "GLUCAP" in the last 168 hours. D-Dimer No results for input(s): "DDIMER" in the last 72 hours. Hgb A1c No results for input(s): "HGBA1C" in the last 72 hours. Lipid Profile No results for input(s): "CHOL", "HDL", "LDLCALC", "TRIG", "CHOLHDL", "LDLDIRECT" in the last 72 hours. Thyroid function studies No results for input(s): "TSH", "T4TOTAL", "T3FREE", "THYROIDAB" in the last 72 hours.  Invalid input(s): "FREET3" Anemia work up No results for input(s): "VITAMINB12", "FOLATE", "FERRITIN", "TIBC", "IRON", "RETICCTPCT" in the last 72 hours. Microbiology Recent Results (from the past 240 hours)  MRSA Next Gen by PCR, Nasal     Status: None   Collection Time: 01/25/24  3:20 PM   Specimen: Nasal Mucosa;  Nasal Swab  Result Value Ref Range Status   MRSA by PCR Next Gen NOT DETECTED NOT DETECTED Final    Comment: (NOTE) The GeneXpert MRSA Assay (FDA approved for NASAL specimens only), is one component of a comprehensive MRSA colonization surveillance program. It is not intended to diagnose MRSA infection nor to guide or monitor treatment for MRSA infections. Test performance is not FDA approved in patients less than 29 years old. Performed at [facility], [address], [place], [place] [address]      Discharge Instructions:   Discharge Instructions     Diet general   Complete by: As directed    Discharge instructions   Complete by: As directed    Cipro and Flagyl to complete a 14-day course of antibiotics.  Advise follow-up colonoscopy minimum 6 - 8 weeks status post hospitalization for assessment in the setting of possible diverticulitis. Hold aspirin for now -if no further bleeding over the next 24 hours anticipate this can be resumed on 01/28/2024   Increase activity slowly   Complete by:  As directed       Allergies as of 01/27/2024       Reactions   Other Rash, Other (See Comments)   Back injection-Steroid Rash and Redness        Medication List     PAUSE taking these medications    aspirin EC 81 MG tablet Wait to take this until your doctor or other care provider tells you to start again. Take 81 mg by mouth daily. Swallow whole.       STOP taking these medications    tiZANidine 2 MG tablet Commonly known as: ZANAFLEX       TAKE these medications    acetaminophen 500 MG tablet Commonly known as: TYLENOL Take 1,000 mg by mouth every 6 (six) hours as needed.   baclofen 10 MG tablet Commonly known as: LIORESAL Take 1 tablet (10 mg total) by mouth 2 (two) times daily as needed for muscle spasms.   CALCIUM + D PO Take 1 tablet by mouth daily.   ciprofloxacin 500 MG tablet Commonly known as: CIPRO Take 1 tablet (500 mg total)  by mouth 2 (two) times daily.   metroNIDAZOLE 500 MG tablet Commonly known as: FLAGYL Take 1 tablet (500 mg total) by mouth every 12 (twelve) hours.   Multivitamin Adult Tabs Take 1 tablet by mouth daily.   ondansetron 4 MG tablet Commonly known as: ZOFRAN Take 4 mg by mouth every 6 (six) hours as needed.   traMADol 50 MG tablet Commonly known as: ULTRAM Take 50 mg by mouth every 6 (six) hours as needed.          Time coordinating discharge: 45 min  Signed:  Joseph Art DO  [facility] 01/27/2024, 10:58 AM

## 2024-02-03 ENCOUNTER — Telehealth: Payer: Self-pay | Admitting: Internal Medicine

## 2024-02-03 NOTE — Telephone Encounter (Signed)
 Inbound call from patient stating that she was at [facility] last week for 3 days with rectal bleeding and is unsure if she needs to have another colonoscopy. Requesting a call back to discuss. Please advise.

## 2024-02-03 NOTE — Telephone Encounter (Signed)
 Patient was admitted with acute GI bleed and abnormal CT scan - possible diverticulitis.  Patient was asked to follow up for a colonoscopy.  She was discharged on antibiotics for the diverticulitis.  She has no more bleeding or pain.  She stopped the antibiotics on her own due to diarrhea.  She is asking if she needs to be scheduled for a colonoscopy.  Patient will come in and see Brigitte Canard, PA on 4/23 at 1:30

## 2024-02-12 ENCOUNTER — Other Ambulatory Visit (INDEPENDENT_AMBULATORY_CARE_PROVIDER_SITE_OTHER)

## 2024-02-12 ENCOUNTER — Ambulatory Visit (INDEPENDENT_AMBULATORY_CARE_PROVIDER_SITE_OTHER): Admitting: Physician Assistant

## 2024-02-12 ENCOUNTER — Encounter: Payer: Self-pay | Admitting: Physician Assistant

## 2024-02-12 VITALS — BP 116/58 | HR 82 | Ht 64.0 in | Wt 181.4 lb

## 2024-02-12 DIAGNOSIS — K625 Hemorrhage of anus and rectum: Secondary | ICD-10-CM | POA: Diagnosis not present

## 2024-02-12 DIAGNOSIS — D62 Acute posthemorrhagic anemia: Secondary | ICD-10-CM

## 2024-02-12 DIAGNOSIS — K5900 Constipation, unspecified: Secondary | ICD-10-CM | POA: Diagnosis not present

## 2024-02-12 DIAGNOSIS — Z8719 Personal history of other diseases of the digestive system: Secondary | ICD-10-CM

## 2024-02-12 DIAGNOSIS — R195 Other fecal abnormalities: Secondary | ICD-10-CM

## 2024-02-12 LAB — CBC WITH DIFFERENTIAL/PLATELET
Basophils Absolute: 0 10*3/uL (ref 0.0–0.1)
Basophils Relative: 1 % (ref 0.0–3.0)
Eosinophils Absolute: 0 10*3/uL (ref 0.0–0.7)
Eosinophils Relative: 0 % (ref 0.0–5.0)
HCT: 35.8 % — ABNORMAL LOW (ref 36.0–46.0)
Hemoglobin: 11.9 g/dL — ABNORMAL LOW (ref 12.0–15.0)
Lymphocytes Relative: 30 % (ref 12.0–46.0)
Lymphs Abs: 1.4 10*3/uL (ref 0.7–4.0)
MCHC: 33.1 g/dL (ref 30.0–36.0)
MCV: 95.1 fl (ref 78.0–100.0)
Monocytes Absolute: 0.7 10*3/uL (ref 0.1–1.0)
Monocytes Relative: 15 % — ABNORMAL HIGH (ref 3.0–12.0)
Neutro Abs: 2.4 10*3/uL (ref 1.4–7.7)
Neutrophils Relative %: 54 % (ref 43.0–77.0)
Platelets: 551 10*3/uL — ABNORMAL HIGH (ref 150.0–400.0)
RBC: 3.77 Mil/uL — ABNORMAL LOW (ref 3.87–5.11)
RDW: 15.5 % (ref 11.5–15.5)
WBC: 4.5 10*3/uL (ref 4.0–10.5)

## 2024-02-12 MED ORDER — NA SULFATE-K SULFATE-MG SULF 17.5-3.13-1.6 GM/177ML PO SOLN: 1.0000 | Freq: Once | ORAL | 0 refills | Status: AC

## 2024-02-12 NOTE — Progress Notes (Signed)
 Noted.

## 2024-02-12 NOTE — Progress Notes (Signed)
 Brigitte Canard, PA-C [address] [place], [place]  [address] Phone: [phone]   Primary Care Physician: Olin Bertin, MD  Primary Gastroenterologist:  Brigitte Canard, PA-C / Legrand Puma, MD   Chief Complaint: Hospital follow-up lower GI diverticular bleed, diverticulitis, anemia       HPI:   Nevayah Faust is a 68 y.o. female, established patient Dr. Elvin Hammer, presents for hospital follow-up of lower GI diverticular bleed, diverticulitis, and anemia.  Was hospitalized 01/25/2024 until 01/27/2024.  Presented to the ED via EMS to evaluate rectal bleeding.  Was on aspirin and celecoxib which were discontinued.  She was passing a lot of maroon-colored blood per rectum and and having LLQ pain.  Initial hemoglobin 10.0 and dropped to 9.5.  Abdominal pelvic CT showed acute descending colon diverticulitis with no abscess or perforation.  Treated with Cipro  and Flagyl .  Was told to follow-up with GI outpatient.  She took Cipro  and Flagyl  for 1 week.  Has not had any more episodes of rectal bleeding or lower abdominal pain since hospital discharge.  She had some mild constipation after having right knee replacement surgery 01/15/2024.  Has taken Colace and MiraLAX sporadically which helped.  CT angio abdomen pelvis 01/25/2024: 1. Acute diverticulitis of the distal descending colon in the upper pelvis. No associated abscess or intraperitoneal free air. Due to focal colonic thickening secondary to presumed diverticulitis, follow-up colonoscopy would be indicated once diverticulitis is resolved to exclude underlying lesion. 2. No active gastrointestinal bleeding identified by CTA. 3. Cholelithiasis without evidence of acute cholecystitis. 4. 3.2 x 2.8 x 3.5 cm cyst of the left adnexa. Recommend follow-up US  in 6-12 months.   05/2022 last colonoscopy by Dr. Elvin Hammer: Many diverticula throughout the entire colon.  Small internal hemorrhoids.  No polyps.  Excellent prep.  10-year repeat.  04/2012 screening  colonoscopy by Dr. Elvin Hammer: Severe diverticulosis, otherwise normal, no polyps.  10-year repeat.  Current Outpatient Medications  Medication Sig Dispense Refill   acetaminophen  (TYLENOL ) 500 MG tablet Take 1,000 mg by mouth every 6 (six) hours as needed.     [Paused] aspirin EC 81 MG tablet Take 81 mg by mouth daily. Swallow whole.     baclofen  5 MG TABS Take 1 tablet (5 mg total) by mouth 3 (three) times daily as needed for muscle spasms. 60 tablet 0   Calcium Citrate-Vitamin D (CALCIUM + D PO) Take 1 tablet by mouth daily.     Multiple Vitamin (MULTIVITAMIN ADULT) TABS Take 1 tablet by mouth daily.     Na Sulfate-K Sulfate-Mg Sulfate concentrate (SUPREP) 17.5-3.13-1.6 GM/177ML SOLN Take 1 kit (354 mLs total) by mouth once for 1 dose. 354 mL 0   Probiotic Product (ALIGN) 10 MG CAPS as directed Orally once daily     traMADol  (ULTRAM ) 50 MG tablet Take 50 mg by mouth every 6 (six) hours as needed.     ciprofloxacin  (CIPRO ) 500 MG tablet Take 1 tablet (500 mg total) by mouth 2 (two) times daily. (Patient not taking: Reported on 02/12/2024) 24 tablet 0   metroNIDAZOLE  (FLAGYL ) 500 MG tablet Take 1 tablet (500 mg total) by mouth every 12 (twelve) hours. (Patient not taking: Reported on 02/12/2024) 24 tablet 0   ondansetron  (ZOFRAN ) 4 MG tablet Take 4 mg by mouth every 6 (six) hours as needed. (Patient not taking: Reported on 02/12/2024)     No current facility-administered medications for this visit.    Allergies as of 02/12/2024 - Review Complete 02/12/2024  Allergen Reaction Noted   Other Rash and Other (See Comments) 03/28/2021    Past Medical History:  Diagnosis Date   Arthritis    fingers,back,knees   Cancer (HCC)    breast,nipple removed form left breast,right breast cancer   Cataract, immature    bilateral   Dental bridge present    upper and lower   Dental crowns present    6-7   Diverticulosis 2013   Severe Diverticulosis by colonoscopy    History of breast cancer    right    History of lumpectomy of right breast 2002   Microcalcifications of the breast 10/2014   left   Obesity    Radiculopathy, lumbar region     Past Surgical History:  Procedure Laterality Date   BRCA inconclusive     BREAST BIOPSY Left 11/18/2014   Procedure: LEFT NIPPLE REMOVAL;  Surgeon: Adalberto Hollow, MD;  Location: [facility];  Service: General;  Laterality: Left;   BREAST LUMPECTOMY Right    CARPAL TUNNEL RELEASE Right 05/27/2008   cataracts Bilateral    COLONOSCOPY     COLONOSCOPY WITH PROPOFOL   05/01/2012   DILATION AND CURETTAGE OF UTERUS     KNEE ARTHROSCOPY     x 3 - 2 on left, 1 on right   left meniscus tear repair     left nipple removal      LYMPH NODE DISSECTION     right carpal tunnel release     right meniscus tear repair     right shoulder surgery     bone chip removal, arthroscopic    SHOULDER ARTHROSCOPY Right 05/27/2008   TONSILLECTOMY     TUBAL LIGATION      Review of Systems:    All systems reviewed and negative except where noted in HPI.    Physical Exam:  BP (!) 116/58   Pulse 82   Ht 5\' 4"  (1.626 m)   Wt 181 lb 6 oz (82.3 kg)   SpO2 97%   BMI 31.13 kg/m  No LMP recorded. Patient is postmenopausal.  General: Well-nourished, well-developed in no acute distress.  Lungs: Clear to auscultation bilaterally. Non-labored. Heart: Regular rate and rhythm, no murmurs rubs or gallops.  Abdomen: Bowel sounds are normal; Abdomen is Soft; No hepatosplenomegaly, masses or hernias;  No Abdominal Tenderness; No guarding or rebound tenderness. Rectal:  Mild Heme positive; no external hemorrhoids, fissures.  No internal rectal masses or tenderness.  No stool in vault. Neuro: Alert and oriented x 3.  Grossly intact.  Psych: Alert and cooperative, normal mood and affect.  Imaging Studies: CT Angio Abd/Pel W and/or Wo Contrast Result Date: 01/25/2024 CLINICAL DATA:  Rectal bleeding, abdominal pain and diarrhea. EXAM: CT ANGIOGRAPHY ABDOMEN AND  PELVIS WITH CONTRAST AND WITHOUT CONTRAST TECHNIQUE: Multidetector CT imaging of the abdomen and pelvis was performed using the standard protocol during bolus administration of intravenous contrast. Multiplanar reconstructed images and MIPs were obtained and reviewed to evaluate the vascular anatomy. RADIATION DOSE REDUCTION: This exam was performed according to the departmental dose-optimization program which includes automated exposure control, adjustment of the mA and/or kV according to patient size and/or use of iterative reconstruction technique. CONTRAST:  OMNIPAQUE  IOHEXOL  350 MG/ML SOLN COMPARISON:  None Available. FINDINGS: VASCULAR Aorta: Normal abdominal aorta without evidence atherosclerosis, aneurysm or dissection. Celiac: Normally patent.  Normally patent branch vessels. SMA: Normally patent.  Normal variant replaced right hepatic artery. Renals: Normally patent single right and paired left renal arteries. IMA: Normally  patent. Inflow: Normally patent and tortuous iliac arteries. Proximal Outflow: Normally patent bilateral common femoral arteries and femoral bifurcations. Veins: Venous phase imaging demonstrates normally patent veins in the abdomen and pelvis with no anatomic variants. Review of the MIP images confirms the above findings. NON-VASCULAR Lower chest: No acute abnormality.  Small hiatal hernia. Hepatobiliary: Small calcified gallstones in a mildly distended gallbladder without gallbladder inflammation or biliary ductal dilatation. Small benign hepatic cyst in the lateral caudate has a benign appearance and measures 10 mm. Pancreas: Fatty infiltration of the tail of the pancreas. No pancreatic ductal dilatation or surrounding inflammatory changes. Spleen: Normal in size without focal abnormality. Adrenals/Urinary Tract: Adrenal glands are unremarkable. Kidneys are normal, without renal calculi, focal lesion, or hydronephrosis. Bladder is unremarkable. Stomach/Bowel: Acute  diverticulitis of the distal descending colon in the upper pelvis with segment colonic thickening and inflammation along the anterior aspect of the descending colon. The descending and sigmoid colon also demonstrate diffuse diverticulosis. No associated abscess or intraperitoneal free air. No active bleeding identified into the gastrointestinal tract on arterial or venous phases of imaging. Bowel shows no evidence of obstruction or ileus. The appendix is normal. Lymphatic: No enlarged abdominal or pelvic lymph nodes. Reproductive: Normal appearance of the uterus. Cyst of the left adnexa measures approximately 3.2 x 2.8 x 3.5 cm and measures simple fluid density internally without visible solid component by CT. Other: No abdominal wall hernia or abnormality. No abdominopelvic ascites. Musculoskeletal: No acute or significant osseous findings. IMPRESSION: 1. Acute diverticulitis of the distal descending colon in the upper pelvis. No associated abscess or intraperitoneal free air. Due to focal colonic thickening secondary to presumed diverticulitis, follow-up colonoscopy would be indicated once diverticulitis is resolved to exclude underlying lesion. 2. No active gastrointestinal bleeding identified by CTA. 3. Cholelithiasis without evidence of acute cholecystitis. 4. 3.2 x 2.8 x 3.5 cm cyst of the left adnexa. Recommend follow-up US  in 6-12 months. Note: This recommendation does not apply to premenarchal patients and to those with increased risk (genetic, family history, elevated tumor markers or other high-risk factors) of ovarian cancer. Reference: JACR 2020 Feb; 17(2):248-254 5. Small hiatal hernia. 6. Normal appearance of the abdominal aorta and its branch vessels. Electronically Signed   By: Erica Hau M.D.   On: 01/25/2024 11:06    Assessment and Plan:   Akisha Sturgill is a 68 y.o. y/o female presents for hospital follow-up of lower GI bleed, most likely due to diverticular bleed.  Currently  improved.  She was treated with Cipro  and Flagyl  for uncomplicated descending colon diverticulitis.  GI symptoms have currently resolved.  She has no abdominal pain or tenderness.  No visible rectal bleeding.  Mildly Hemoccult positive stool today.  I am repeating lab work with close follow-up.  1.  Uncomplicated descending colon diverticulitis  Resolved posttreatment with Cipro  and Flagyl .  2.  Lower GI bleed, probable diverticular bleed -improved.  Stressed the importance of treating underlying constipation.  Prevent hard stools and straining.  Take Colace and MiraLAX  daily.  Increase rest and clear fluids.  If she has recurrent heavy rectal bleeding, she is instructed to return to the ED.  3.  Acute blood loss anemia  Labs: CBC, iron panel, ferritin, B12  Not currently on iron or B12.  If low, then start supplements.  4.  Colon cancer screening -is up-to-date.  Negative colonoscopies in 2023 and 2013.  Tentatively plan for colonoscopy in 6 weeks.  Follow-up visit in 4 weeks with TG (prior  to colonoscopy).  Brigitte Canard, PA-C  Follow up in 4 weeks with TG.

## 2024-02-12 NOTE — Patient Instructions (Addendum)
 Please take OTC Colace stool softener 100 mg 1 tablet once daily. Also take OTC MiraLAX 1 tablespoon once daily and a drink to prevent constipation.  Your provider has requested that you go to the basement level for lab work before leaving today. Press "B" on the elevator. The lab is located at the first door on the left as you exit the elevator.  You have been scheduled for a colonoscopy. Please follow written instructions given to you at your visit today.   If you use inhalers (even only as needed), please bring them with you on the day of your procedure.  DO NOT TAKE 7 DAYS PRIOR TO TEST- Trulicity (dulaglutide) Ozempic, Wegovy (semaglutide) Mounjaro (tirzepatide) Bydureon Bcise (exanatide extended release)  DO NOT TAKE 1 DAY PRIOR TO YOUR TEST Rybelsus (semaglutide) Adlyxin (lixisenatide) Victoza (liraglutide) Byetta (exanatide)   You have been schedule for a Pre-Procedure follow up with Brian Campanile 03/11/24 @ 9:20am  If your blood pressure at your visit was 140/90 or greater, please contact your primary care physician to follow up on this.  _______________________________________________________  If you are age 61 or older, your body mass index should be between 23-30. Your Body mass index is 31.13 kg/m. If this is out of the aforementioned range listed, please consider follow up with your Primary Care Provider.  If you are age 66 or younger, your body mass index should be between 19-25. Your Body mass index is 31.13 kg/m. If this is out of the aformentioned range listed, please consider follow up with your Primary Care Provider.   ________________________________________________________  The [facility] providers would like to encourage you to use MYCHART to communicate with providers for non-urgent requests or questions.  Due to long hold times on the telephone, sending your provider a message by [facility] may be a faster and more efficient way to get a response.  Please allow 48  business hours for a response.  Please remember that this is for non-urgent requests.  _______________________________________________________   It was a pleasure to see you today!  Thank you for trusting me with your gastrointestinal care!

## 2024-02-13 LAB — IRON,TIBC AND FERRITIN PANEL
%SAT: 16 % (ref 16–45)
Ferritin: 105 ng/mL (ref 16–288)
Iron: 56 ug/dL (ref 45–160)
TIBC: 346 ug/dL (ref 250–450)

## 2024-02-28 ENCOUNTER — Encounter (HOSPITAL_COMMUNITY): Payer: Self-pay

## 2024-02-28 ENCOUNTER — Other Ambulatory Visit (HOSPITAL_COMMUNITY): Payer: Self-pay

## 2024-02-28 ENCOUNTER — Telehealth (HOSPITAL_COMMUNITY): Payer: Self-pay | Admitting: Pharmacy Technician

## 2024-02-28 ENCOUNTER — Other Ambulatory Visit: Payer: Self-pay

## 2024-02-28 ENCOUNTER — Inpatient Hospital Stay (HOSPITAL_COMMUNITY)
Admission: EM | Admit: 2024-02-28 | Discharge: 2024-03-01 | DRG: 863 | Disposition: A | Discharge status: Home / Self Care | Attending: Orthopedic Surgery | Admitting: Orthopedic Surgery

## 2024-02-28 DIAGNOSIS — E669 Obesity, unspecified: Secondary | ICD-10-CM | POA: Diagnosis present

## 2024-02-28 DIAGNOSIS — T8149XA Infection following a procedure, other surgical site, initial encounter: Secondary | ICD-10-CM | POA: Diagnosis not present

## 2024-02-28 DIAGNOSIS — Z96651 Presence of right artificial knee joint: Secondary | ICD-10-CM | POA: Diagnosis present

## 2024-02-28 DIAGNOSIS — Z6830 Body mass index (BMI) 30.0-30.9, adult: Secondary | ICD-10-CM | POA: Diagnosis not present

## 2024-02-28 DIAGNOSIS — M170 Bilateral primary osteoarthritis of knee: Secondary | ICD-10-CM | POA: Diagnosis present

## 2024-02-28 DIAGNOSIS — Z79899 Other long term (current) drug therapy: Secondary | ICD-10-CM

## 2024-02-28 DIAGNOSIS — M19041 Primary osteoarthritis, right hand: Secondary | ICD-10-CM | POA: Diagnosis present

## 2024-02-28 DIAGNOSIS — Y838 Other surgical procedures as the cause of abnormal reaction of the patient, or of later complication, without mention of misadventure at the time of the procedure: Secondary | ICD-10-CM | POA: Diagnosis present

## 2024-02-28 DIAGNOSIS — Z853 Personal history of malignant neoplasm of breast: Secondary | ICD-10-CM

## 2024-02-28 DIAGNOSIS — M009 Pyogenic arthritis, unspecified: Secondary | ICD-10-CM | POA: Diagnosis present

## 2024-02-28 DIAGNOSIS — L03115 Cellulitis of right lower limb: Secondary | ICD-10-CM | POA: Diagnosis present

## 2024-02-28 DIAGNOSIS — Z8249 Family history of ischemic heart disease and other diseases of the circulatory system: Secondary | ICD-10-CM | POA: Diagnosis not present

## 2024-02-28 DIAGNOSIS — D75838 Other thrombocytosis: Secondary | ICD-10-CM | POA: Diagnosis present

## 2024-02-28 DIAGNOSIS — M19042 Primary osteoarthritis, left hand: Secondary | ICD-10-CM | POA: Diagnosis present

## 2024-02-28 DIAGNOSIS — B9562 Methicillin resistant Staphylococcus aureus infection as the cause of diseases classified elsewhere: Secondary | ICD-10-CM | POA: Diagnosis present

## 2024-02-28 DIAGNOSIS — B9689 Other specified bacterial agents as the cause of diseases classified elsewhere: Secondary | ICD-10-CM | POA: Diagnosis not present

## 2024-02-28 DIAGNOSIS — Z7982 Long term (current) use of aspirin: Secondary | ICD-10-CM

## 2024-02-28 DIAGNOSIS — T8141XA Infection following a procedure, superficial incisional surgical site, initial encounter: Principal | ICD-10-CM | POA: Diagnosis present

## 2024-02-28 DIAGNOSIS — R000 Tachycardia, unspecified: Secondary | ICD-10-CM | POA: Diagnosis present

## 2024-02-28 DIAGNOSIS — M479 Spondylosis, unspecified: Secondary | ICD-10-CM | POA: Diagnosis present

## 2024-02-28 DIAGNOSIS — M7989 Other specified soft tissue disorders: Secondary | ICD-10-CM | POA: Diagnosis present

## 2024-02-28 DIAGNOSIS — N764 Abscess of vulva: Secondary | ICD-10-CM | POA: Diagnosis present

## 2024-02-28 DIAGNOSIS — T8140XA Infection following a procedure, unspecified, initial encounter: Principal | ICD-10-CM

## 2024-02-28 LAB — CBC WITH DIFFERENTIAL/PLATELET
Abs Immature Granulocytes: 0.01 10*3/uL (ref 0.00–0.07)
Basophils Absolute: 0.1 10*3/uL (ref 0.0–0.1)
Basophils Relative: 1 %
Eosinophils Absolute: 0 10*3/uL (ref 0.0–0.5)
Eosinophils Relative: 0 %
HCT: 38.3 % (ref 36.0–46.0)
Hemoglobin: 12.2 g/dL (ref 12.0–15.0)
Immature Granulocytes: 0 %
Lymphocytes Relative: 13 %
Lymphs Abs: 1.1 10*3/uL (ref 0.7–4.0)
MCH: 30.7 pg (ref 26.0–34.0)
MCHC: 31.9 g/dL (ref 30.0–36.0)
MCV: 96.5 fL (ref 80.0–100.0)
Monocytes Absolute: 1 10*3/uL (ref 0.1–1.0)
Monocytes Relative: 11 %
Neutro Abs: 6.6 10*3/uL (ref 1.7–7.7)
Neutrophils Relative %: 75 %
Platelets: 405 10*3/uL — ABNORMAL HIGH (ref 150–400)
RBC: 3.97 MIL/uL (ref 3.87–5.11)
RDW: 13.2 % (ref 11.5–15.5)
WBC: 8.8 10*3/uL (ref 4.0–10.5)
nRBC: 0 % (ref 0.0–0.2)

## 2024-02-28 LAB — COMPREHENSIVE METABOLIC PANEL WITH GFR
ALT: 13 U/L (ref 0–44)
AST: 17 U/L (ref 15–41)
Albumin: 4 g/dL (ref 3.5–5.0)
Alkaline Phosphatase: 51 U/L (ref 38–126)
Anion gap: 7 (ref 5–15)
BUN: 18 mg/dL (ref 8–23)
CO2: 23 mmol/L (ref 22–32)
Calcium: 9 mg/dL (ref 8.9–10.3)
Chloride: 104 mmol/L (ref 98–111)
Creatinine, Ser: 0.84 mg/dL (ref 0.44–1.00)
GFR, Estimated: >60 mL/min (ref >60)
Glucose, Bld: 103 mg/dL — ABNORMAL HIGH (ref 70–99)
Potassium: 4.1 mmol/L (ref 3.5–5.1)
Sodium: 134 mmol/L — ABNORMAL LOW (ref 135–145)
Total Bilirubin: 0.4 mg/dL (ref 0.0–1.2)
Total Protein: 7.3 g/dL (ref 6.5–8.1)

## 2024-02-28 LAB — SYNOVIAL CELL COUNT + DIFF, W/ CRYSTALS
Crystals, Fluid: NONE SEEN
Eosinophils-Synovial: 0 % (ref 0–1)
Lymphocytes-Synovial Fld: 70 % — ABNORMAL HIGH (ref 0–20)
Monocyte-Macrophage-Synovial Fluid: 8 % — ABNORMAL LOW (ref 50–90)
Neutrophil, Synovial: 22 % (ref 0–25)
Other Cells-SYN: 0
WBC, Synovial: 680 /mm3 — ABNORMAL HIGH (ref 0–200)

## 2024-02-28 LAB — SEDIMENTATION RATE: Sed Rate: 36 mm/h — ABNORMAL HIGH (ref 0–22)

## 2024-02-28 LAB — C-REACTIVE PROTEIN: CRP: 6.6 mg/dL — ABNORMAL HIGH (ref <1.0)

## 2024-02-28 MED ORDER — ACETAMINOPHEN 325 MG PO TABS
650.0000 mg | ORAL_TABLET | Freq: Four times a day (QID) | ORAL | Status: DC | PRN
  Administered 2024-02-28 – 2024-03-01 (×5): 650 mg via ORAL
  Filled 2024-02-28 (×4): qty 2

## 2024-02-28 MED ORDER — MELATONIN 5 MG PO TABS
5.0000 mg | ORAL_TABLET | Freq: Every day | ORAL | Status: DC
  Administered 2024-02-28 – 2024-02-29 (×2): 5 mg via ORAL
  Filled 2024-02-28 (×2): qty 1

## 2024-02-28 MED ORDER — LINEZOLID 600 MG PO TABS
600.0000 mg | ORAL_TABLET | Freq: Two times a day (BID) | ORAL | Status: DC
  Administered 2024-02-28 – 2024-03-01 (×4): 600 mg via ORAL
  Filled 2024-02-28 (×4): qty 1

## 2024-02-28 MED ORDER — DOCUSATE SODIUM 100 MG PO CAPS
100.0000 mg | ORAL_CAPSULE | Freq: Two times a day (BID) | ORAL | Status: DC
  Administered 2024-02-28 – 2024-03-01 (×4): 100 mg via ORAL
  Filled 2024-02-28 (×5): qty 1

## 2024-02-28 MED ORDER — POLYETHYLENE GLYCOL 3350 17 G PO PACK
17.0000 g | PACK | Freq: Every day | ORAL | Status: DC
  Administered 2024-02-28 – 2024-03-01 (×3): 17 g via ORAL
  Filled 2024-02-28 (×3): qty 1

## 2024-02-28 MED ORDER — VANCOMYCIN HCL 1750 MG/350ML IV SOLN
1750.0000 mg | Freq: Once | INTRAVENOUS | Status: DC
  Administered 2024-02-28: 1750 mg via INTRAVENOUS
  Filled 2024-02-28: qty 350

## 2024-02-28 MED ORDER — TRAMADOL HCL 50 MG PO TABS
50.0000 mg | ORAL_TABLET | Freq: Four times a day (QID) | ORAL | Status: DC | PRN
  Administered 2024-02-28 – 2024-02-29 (×2): 50 mg via ORAL
  Filled 2024-02-28 (×2): qty 1

## 2024-02-28 MED ORDER — SODIUM CHLORIDE 0.9 % IV SOLN
2.0000 g | Freq: Once | INTRAVENOUS | Status: AC
  Administered 2024-02-28: 2 g via INTRAVENOUS
  Filled 2024-02-28: qty 12.5

## 2024-02-28 NOTE — Progress Notes (Signed)
 ED Pharmacy Antibiotic Sign Off An antibiotic consult was received from an ED provider for vancomycin and cefepime per pharmacy dosing for wound infection. A chart review was completed to assess appropriateness.   The following one time order(s) were placed:  - cefepime 2gm  - vancomycin 1750mg    Further antibiotic and/or antibiotic pharmacy consults should be ordered by the admitting provider if indicated.   Thank you for allowing pharmacy to be a part of this patient's care.   Spurgeon Dyer, [facility]  Clinical Pharmacist 02/28/24 11:55 AM

## 2024-02-28 NOTE — ED Provider Notes (Signed)
 [place] EMERGENCY DEPARTMENT AT [facility] Provider Note  CSN: [id number] Arrival date & time: 02/28/24 1038  Chief Complaint(s) Post-op Problem  HPI Alyssa [name] is a 68 y.o. female with PMH osteoarthritis, breast cancer, diverticulosis, breast cancer status postlumpectomy, right TKA on 01/15/2024 by Dr. Murrell Arrant, hospital admission for diverticulitis with a lower GI bleed who presents emergency department for eval of knee erythema and swelling.  States that yesterday she started to notice erythema around the kneecap that was tender to palpation and draining fluid.  Saw the PA on-call for the [facility] orthopedic office yesterday who placed her on an additional antibiotic.  Symptoms not improving and she came to the emergency room for further evaluation.  Here in the ED, patient is tachycardic but vital signs otherwise stable.  Denies chills, chest pain, shortness of breath, nausea, vomiting or other systemic symptoms.  Patient is able to ambulate and range the knee.   Past Medical History Past Medical History:  Diagnosis Date   Arthritis    fingers,back,knees   Cancer (HCC)    breast,nipple removed form left breast,right breast cancer   Cataract, immature    bilateral   Dental bridge present    upper and lower   Dental crowns present    6-7   Diverticulosis 2013   Severe Diverticulosis by colonoscopy    History of breast cancer    right   History of lumpectomy of right breast 2002   Microcalcifications of the breast 10/2014   left   Obesity    Radiculopathy, lumbar region    Patient Active Problem List   Diagnosis Date Noted   Joint infection (HCC) 02/28/2024   Lower GI bleed 01/25/2024   Normocytic anemia 01/25/2024   Thrombocytosis 01/25/2024   Hyperglycemia 01/25/2024   Mild protein malnutrition (HCC) 01/25/2024   S/P knee surgery 01/25/2024   Diverticulitis large intestine 01/25/2024   ABLA (acute blood loss anemia) 01/25/2024   Malignant tumor of  breast (HCC) 06/01/2022   BPPV (benign paroxysmal positional vertigo) 08/10/2021   Impacted cerumen of left ear 08/10/2021   Home Medication(s) Prior to Admission medications   Medication Sig Start Date End Date Taking? Authorizing Provider  acetaminophen  (TYLENOL ) 500 MG tablet Take 1,000 mg by mouth every 6 (six) hours as needed.    [provider]  aspirin EC 81 MG tablet Take 81 mg by mouth daily. Swallow whole.    [provider]  baclofen  5 MG TABS Take 1 tablet (5 mg total) by mouth 3 (three) times daily as needed for muscle spasms. 01/27/24 01/26/25  Enrigue Harvard, DO  Calcium Citrate-Vitamin D (CALCIUM + D PO) Take 1 tablet by mouth daily.    [provider]  ciprofloxacin  (CIPRO ) 500 MG tablet Take 1 tablet (500 mg total) by mouth 2 (two) times daily. Patient not taking: Reported on 02/12/2024 01/27/24   Vann, Jessica U, DO  metroNIDAZOLE  (FLAGYL ) 500 MG tablet Take 1 tablet (500 mg total) by mouth every 12 (twelve) hours. Patient not taking: Reported on 02/12/2024 01/27/24   Vann, Jessica U, DO  Multiple Vitamin (MULTIVITAMIN ADULT) TABS Take 1 tablet by mouth daily.    [provider]  ondansetron  (ZOFRAN ) 4 MG tablet Take 4 mg by mouth every 6 (six) hours as needed. Patient not taking: Reported on 02/12/2024 01/08/24   [provider]  Probiotic Product (ALIGN) 10 MG CAPS as directed Orally once daily    [provider]  traMADol  (ULTRAM ) 50  MG tablet Take 50 mg by mouth every 6 (six) hours as needed. 01/21/24   [provider]                                                                                                                                    Past Surgical History Past Surgical History:  Procedure Laterality Date   BRCA inconclusive     BREAST BIOPSY Left 11/18/2014   Procedure: LEFT NIPPLE REMOVAL;  Surgeon: Adalberto Hollow, MD;  Location: [facility];  Service: General;  Laterality: Left;   BREAST  LUMPECTOMY Right    CARPAL TUNNEL RELEASE Right 05/27/2008   cataracts Bilateral    COLONOSCOPY     COLONOSCOPY WITH PROPOFOL   05/01/2012   DILATION AND CURETTAGE OF UTERUS     KNEE ARTHROSCOPY     x 3 - 2 on left, 1 on right   left meniscus tear repair     left nipple removal      LYMPH NODE DISSECTION     right carpal tunnel release     right meniscus tear repair     right shoulder surgery     bone chip removal, arthroscopic    SHOULDER ARTHROSCOPY Right 05/27/2008   TONSILLECTOMY     TUBAL LIGATION     Family History Family History  Problem Relation Age of Onset   Colon polyps Father    Heart disease Father    Colon cancer Neg Hx    Crohn's disease Neg Hx    Esophageal cancer Neg Hx    Rectal cancer Neg Hx    Stomach cancer Neg Hx     Social History Social History   Tobacco Use   Smoking status: Never    Passive exposure: Past (as a child mom smoked)   Smokeless tobacco: Never  Vaping Use   Vaping status: Never Used  Substance Use Topics   Alcohol use: Yes    Comment: occasionally   Drug use: No   Allergies Other  Review of Systems Review of Systems  Musculoskeletal:  Positive for arthralgias.  Skin:  Positive for rash.    Physical Exam Vital Signs  I have reviewed the triage vital signs BP (!) 104/91   Pulse (!) 111   Temp 98.9 F (37.2 C) (Oral)   Resp 19   Ht 5\' 4"  (1.626 m)   Wt 79.4 kg   SpO2 98%   BMI 30.04 kg/m   Physical Exam Vitals and nursing note reviewed.  Constitutional:      General: She is not in acute distress.    Appearance: She is well-developed.  HENT:     Head: Normocephalic and atraumatic.  Eyes:     Conjunctiva/sclera: Conjunctivae normal.  Cardiovascular:     Rate and Rhythm: Normal rate and regular rhythm.     Heart sounds: No murmur heard. Pulmonary:     Effort: Pulmonary effort  is normal. No respiratory distress.     Breath sounds: Normal breath sounds.  Abdominal:     Palpations: Abdomen is soft.      Tenderness: There is no abdominal tenderness.  Musculoskeletal:        General: No swelling.     Cervical back: Neck supple.  Skin:    General: Skin is warm and dry.     Capillary Refill: Capillary refill takes less than 2 seconds.     Findings: Erythema present.  Neurological:     Mental Status: She is alert.  Psychiatric:        Mood and Affect: Mood normal.     ED Results and Treatments Labs (all labs ordered are listed, but only abnormal results are displayed) Labs Reviewed  COMPREHENSIVE METABOLIC PANEL WITH GFR - Abnormal; Notable for the following components:      Result Value   Sodium 134 (*)    Glucose, Bld 103 (*)    All other components within normal limits  CBC WITH DIFFERENTIAL/PLATELET - Abnormal; Notable for the following components:   Platelets 405 (*)    All other components within normal limits  AEROBIC/ANAEROBIC CULTURE W GRAM STAIN (SURGICAL/DEEP WOUND)  CULTURE, BLOOD (ROUTINE X 2)  CULTURE, BLOOD (ROUTINE X 2)  SEDIMENTATION RATE  C-REACTIVE PROTEIN                                                                                                                          Radiology No results found.  Pertinent labs & imaging results that were available during my care of the patient were reviewed by me and considered in my medical decision making (see MDM for details).  Medications Ordered in ED Medications  vancomycin (VANCOREADY) IVPB 1750 mg/350 mL (has no administration in time range)  ceFEPIme (MAXIPIME) 2 g in sodium chloride  0.9 % 100 mL IVPB (2 g Intravenous New Bag/Given 02/28/24 1232)                                                                                                                                     Procedures Procedures  (including critical care time)  Medical Decision Making / ED Course   This patient presents to the ED for concern of knee pain and swelling, this involves an extensive number of treatment options, and is a  complaint that carries with it a high risk  of complications and morbidity.  The differential diagnosis includes joint space infection, superficial wound infection, abscess, cellulitis  MDM: Patient seen emerged part for evaluation of knee pain and swelling.  Physical exam with erythema surrounding the incisional site over the patella on the right with expressible purulent fluid on palpation.  Laboratory evaluation unremarkable.  I spoke with patient's primary orthopedic surgeon Dr. Murrell Arrant who is recommending admission to his service for operative intervention in the morning.  Wound and blood cultures obtained and broad-spectrum antibiotic started.  Patient admitted.    Additional history obtained: -Additional history obtained from husband -External records from outside source obtained and reviewed including: Chart review including previous notes, labs, imaging, consultation notes   Lab Tests: -I ordered, reviewed, and interpreted labs.   The pertinent results include:   Labs Reviewed  COMPREHENSIVE METABOLIC PANEL WITH GFR - Abnormal; Notable for the following components:      Result Value   Sodium 134 (*)    Glucose, Bld 103 (*)    All other components within normal limits  CBC WITH DIFFERENTIAL/PLATELET - Abnormal; Notable for the following components:   Platelets 405 (*)    All other components within normal limits  AEROBIC/ANAEROBIC CULTURE W GRAM STAIN (SURGICAL/DEEP WOUND)  CULTURE, BLOOD (ROUTINE X 2)  CULTURE, BLOOD (ROUTINE X 2)  SEDIMENTATION RATE  C-REACTIVE PROTEIN     Medicines ordered and prescription drug management: Meds ordered this encounter  Medications   vancomycin (VANCOREADY) IVPB 1750 mg/350 mL    Indication::   Wound Infection   ceFEPIme (MAXIPIME) 2 g in sodium chloride  0.9 % 100 mL IVPB    Antibiotic Indication::   Wound Infection    -I have reviewed the patients home medicines and have made adjustments as needed  Critical  interventions none  Consultations Obtained: I requested consultation with the orthopedic surgeon on-call Dr. Murrell Arrant,  and discussed lab and imaging findings as well as pertinent plan - they recommend: Admission to his service for operative intervention in the morning   Cardiac Monitoring: The patient was maintained on a cardiac monitor.  I personally viewed and interpreted the cardiac monitored which showed an underlying rhythm of: NSR, sinus tachycardia  Social Determinants of Health:  Factors impacting patients care include: none   Reevaluation: After the interventions noted above, I reevaluated the patient and found that they have :improved  Co morbidities that complicate the patient evaluation  Past Medical History:  Diagnosis Date   Arthritis    fingers,back,knees   Cancer (HCC)    breast,nipple removed form left breast,right breast cancer   Cataract, immature    bilateral   Dental bridge present    upper and lower   Dental crowns present    6-7   Diverticulosis 2013   Severe Diverticulosis by colonoscopy    History of breast cancer    right   History of lumpectomy of right breast 2002   Microcalcifications of the breast 10/2014   left   Obesity    Radiculopathy, lumbar region       Dispostion: I considered admission for this patient and patient will require hospital admission for operative intervention     Final Clinical Impression(s) / ED Diagnoses Final diagnoses:  None     @PCDICTATION @    Karlyn Overman, MD 02/28/24 1238

## 2024-02-28 NOTE — Telephone Encounter (Signed)
 Patient Product/process development scientist completed.    The patient is insured through [employer]. Patient has Medicare and is not eligible for a copay card, but may be able to apply for patient assistance or Medicare RX Payment Plan (Patient Must reach out to their plan, if eligible for payment plan), if available.    Ran test claim for linezolid 600 mg and the current 14 day co-pay is $65.30.   This test claim was processed through [facility]- copay amounts may vary at other pharmacies due to pharmacy/plan contracts, or as the patient moves through the different stages of their insurance plan.     Morgan Arab, CPHT Pharmacy Technician III Certified Patient Advocate [facility] Patient Advocate Team Direct Number: [phone]  Fax: [phone]

## 2024-02-28 NOTE — Plan of Care (Signed)

## 2024-02-28 NOTE — Consult Note (Signed)
 [facility]    Date of Admission:  02/28/2024     Reason for Consult: surgical site infection    Referring Provider: Neil Balls     Lines:  peripheral  Abx: Vanc  Outpatient bactrim x4 days        Assessment: 68 yo female nonsmoker, non-diabetic, hx breast cancer, obese, hx tka right side 3/26, [facility] for concern of acute redness on the right knee incision site, in setting of left labial carbuncle a week prior  Patient has had no systemic sx; her left carbuncle is draining and small about 1 cm in induration size without fluctuance and likely responding to abx well.   Unclear if the surgical site cellulitis is related. Eitherway the cellulitis I agree with Dr Murrell Arrant appear to be superficial (although usually hard to tell).  We can try abx for now  Plan: Stop vanc; start Linezolid Will check on her again this weekend  Appreciate ortho closely monitoring and hope to avoid surgery Bcx obtained after abx already given (bactrim outpatient) and will see if anything grows although low suspicion Maintain standard isolation precaution Discussed with ortho      ------------------------------------------------ Principal Problem:   Joint infection (HCC)    HPI: Alyssa [name] is a 68 y.o. female  nonsmoker, non-diabetic, hx breast cancer, obese, hx tka right side 3/26, [facility] for concern of acute redness on the right knee incision site, in setting of left labial carbuncle a week prior  Patient has had no systemic sx; her left carbuncle is draining and small about 1 cm in induration size without fluctuance and likely responding to abx well.   Patient said she just noticed the surgical site got red for a day or 2 prior to admission. There is some itch, not pain. She is ambulating fine with good rom  No malaise, subjective f/c The groin carbuncle is improving as well  Sed rate 36 this admission No  leukocytosis Alb 4.0 Reactive thrombocytosis starting 4/5 to 4/23 is improving as well  She is on vanc here Bcx in progress   Family History  Problem Relation Age of Onset   Colon polyps Father    Heart disease Father    Colon cancer Neg Hx    Crohn's disease Neg Hx    Esophageal cancer Neg Hx    Rectal cancer Neg Hx    Stomach cancer Neg Hx     Social History   Tobacco Use   Smoking status: Never    Passive exposure: Past (as a child mom smoked)   Smokeless tobacco: Never  Vaping Use   Vaping status: Never Used  Substance Use Topics   Alcohol use: Yes    Comment: occasionally   Drug use: No    Allergies  Allergen Reactions   Other Rash and Other (See Comments)    Back injection-Steroid Rash and Redness    Review of Systems: ROS All Other ROS was negative, except mentioned above   Past Medical History:  Diagnosis Date   Arthritis    fingers,back,knees   Cancer (HCC)    breast,nipple removed form left breast,right breast cancer   Cataract, immature    bilateral   Dental bridge present    upper and lower   Dental crowns present    6-7   Diverticulosis 2013   Severe Diverticulosis by colonoscopy    History of breast cancer  right   History of lumpectomy of right breast 2002   Microcalcifications of the breast 10/2014   left   Obesity    Radiculopathy, lumbar region        Scheduled Meds: Continuous Infusions:  vancomycin 1,750 mg (02/28/24 1318)   PRN Meds:.   OBJECTIVE: Blood pressure 116/67, pulse 84, temperature 98.3 F (36.8 C), temperature source Oral, resp. rate 20, height 5\' 4"  (1.626 m), weight 79.4 kg, SpO2 95%.  Physical Exam  General/constitutional: no distress, pleasant HEENT: Normocephalic, PER, Conj Clear, EOMI, Oropharynx clear Neck supple CV: rrr no mrg Lungs: clear to auscultation, normal respiratory effort Abd: Soft, Nontender Ext: no edema Neuro: nonfocal Skin/MSK: good active/passive rom of right prosthetic  knee with mild effusion; incision intact and celluitis change visualized without fluctuance; left groin 1 inch induration, open carbuncle no drainage at this time and mildly tender to palpation       Lab Results Lab Results  Component Value Date   WBC 8.8 02/28/2024   HGB 12.2 02/28/2024   HCT 38.3 02/28/2024   MCV 96.5 02/28/2024   PLT 405 (H) 02/28/2024    Lab Results  Component Value Date   CREATININE 0.84 02/28/2024   BUN 18 02/28/2024   NA 134 (L) 02/28/2024   K 4.1 02/28/2024   CL 104 02/28/2024   CO2 23 02/28/2024    Lab Results  Component Value Date   ALT 13 02/28/2024   AST 17 02/28/2024   ALKPHOS 51 02/28/2024   BILITOT 0.4 02/28/2024      Microbiology: No results found for this or any previous visit (from the past 240 hours).   Serology:    Imaging: If present, new imagings (plain films, ct scans, and mri) have been personally visualized and interpreted; radiology reports have been reviewed. Decision making incorporated into the Impression / Recommendations.    Jamesetta Mcbride, MD [facility] [phone] pager    02/28/2024, 1:43 PM

## 2024-02-28 NOTE — ED Notes (Signed)
 Patient appears awake, alert and oriented. Breathing even and unlabored. Report given to Sherline Distel, [place]

## 2024-02-28 NOTE — ED Triage Notes (Signed)
 Patient had right knee replacement 6 weeks ago. Yesterday noticed her knee cap was red and bulging and hot to touch. Today began bleeding.

## 2024-02-28 NOTE — H&P (Cosign Needed Addendum)
 Chief Complaint: Surgical site infection right knee  HPI: Alyssa [name] is a 68 y.o. female who presents for evaluation of right knee redness with a small amount of drainage 6 weeks status post right TKA.. It has been present for 1 day and has been worsening.   She is a female  nonsmoker, non-diabetic, hx breast cancer, obese, hx tka right side 3/26, [facility] for concern of acute redness on the right knee incision site, in setting of left labial carbuncle a week prior   Patient has had no systemic sx; her left carbuncle is draining and small about 1 cm in induration size without fluctuance and likely responding to abx well.    Patient said she just noticed the surgical site got red for a day or 2 prior to admission. There is some itch, not pain. She is ambulating fine with good rom.  She went to Dr. Murrell Arrant office yesterday where she was found to be afebrile.  She was started on cefadroxil 500 mg twice daily in addition to the Septra which she had been on.  She came back to the office today with some increased redness and a small pinhole amount of drainage.  It was felt that she needed to be admitted to the hospital for IV antibiotics and close orthopedic observation as well as infectious disease consult.  Past Medical History:  Diagnosis Date   Arthritis    fingers,back,knees   Cancer (HCC)    breast,nipple removed form left breast,right breast cancer   Cataract, immature    bilateral   Dental bridge present    upper and lower   Dental crowns present    6-7   Diverticulosis 2013   Severe Diverticulosis by colonoscopy    History of breast cancer    right   History of lumpectomy of right breast 2002   Microcalcifications of the breast 10/2014   left   Obesity    Radiculopathy, lumbar region    Past Surgical History:  Procedure Laterality Date   BRCA inconclusive     BREAST BIOPSY Left 11/18/2014   Procedure: LEFT NIPPLE REMOVAL;  Surgeon: Adalberto Hollow, MD;   Location: [facility];  Service: General;  Laterality: Left;   BREAST LUMPECTOMY Right    CARPAL TUNNEL RELEASE Right 05/27/2008   cataracts Bilateral    COLONOSCOPY     COLONOSCOPY WITH PROPOFOL   05/01/2012   DILATION AND CURETTAGE OF UTERUS     KNEE ARTHROSCOPY     x 3 - 2 on left, 1 on right   left meniscus tear repair     left nipple removal      LYMPH NODE DISSECTION     right carpal tunnel release     right meniscus tear repair     right shoulder surgery     bone chip removal, arthroscopic    SHOULDER ARTHROSCOPY Right 05/27/2008   TONSILLECTOMY     TUBAL LIGATION     Social History   Socioeconomic History   Marital status: Married    Spouse name: Not on file   Number of children: Not on file   Years of education: Not on file   Highest education level: Not on file  Occupational History   Not on file  Tobacco Use   Smoking status: Never    Passive exposure: Past (as a child mom smoked)   Smokeless tobacco: Never  Vaping Use   Vaping status: Never Used  Substance and  Sexual Activity   Alcohol use: Yes    Comment: occasionally   Drug use: No   Sexual activity: Not on file  Other Topics Concern   Not on file  Social History Narrative   Not on file   Social Drivers of Health   Financial Resource Strain: Not on file  Food Insecurity: No Food Insecurity (02/28/2024)   Hunger Vital Sign    Worried About Running Out of Food in the Last Year: Never true    Ran Out of Food in the Last Year: Never true  Transportation Needs: No Transportation Needs (02/28/2024)   PRAPARE - Administrator, Civil Service (Medical): No    Lack of Transportation (Non-Medical): No  Physical Activity: Not on file  Stress: Not on file  Social Connections: Moderately Integrated (02/28/2024)   Social Connection and Isolation Panel [NHANES]    Frequency of Communication with Friends and Family: Three times a week    Frequency of Social Gatherings with Friends and  Family: Once a week    Attends Religious Services: More than 4 times per year    Active Member of [employer] or Organizations: No    Attends Engineer, structural: Never    Marital Status: Married   Family History  Problem Relation Age of Onset   Colon polyps Father    Heart disease Father    Colon cancer Neg Hx    Crohn's disease Neg Hx    Esophageal cancer Neg Hx    Rectal cancer Neg Hx    Stomach cancer Neg Hx    No Known Allergies Prior to Admission medications   Medication Sig Start Date End Date Taking? Authorizing Provider  acetaminophen  (TYLENOL ) 500 MG tablet Take 1,000 mg by mouth every 6 (six) hours as needed for mild pain (pain score 1-3) or moderate pain (pain score 4-6).   Yes [provider]  aspirin EC 81 MG tablet Take 81 mg by mouth daily. Swallow whole.   Yes [provider]  Calcium Citrate-Vitamin D (CALCIUM + D PO) Take 1 tablet by mouth daily.   Yes [provider]  cefadroxil (DURICEF) 500 MG capsule Take 500 mg by mouth 2 (two) times daily. 02/27/24 03/05/24 Yes [provider]  COLACE 100 MG capsule Take 100 mg by mouth 2 (two) times daily. 01/06/24  Yes [provider]  Multiple Vitamin (MULTIVITAMIN ADULT) TABS Take 1 tablet by mouth daily.   Yes [provider]  ondansetron  (ZOFRAN ) 4 MG tablet Take 4 mg by mouth every 6 (six) hours as needed. 01/08/24  Yes [provider]  Probiotic Product (ALIGN) 10 MG CAPS Take 1 capsule by mouth daily at 12 noon.   Yes [provider]  sulfamethoxazole-trimethoprim (BACTRIM DS) 800-160 MG tablet Take 1 tablet by mouth 2 (two) times daily. 02/24/24 03/02/24 Yes [provider]  baclofen  5 MG TABS Take 1 tablet (5 mg total) by mouth 3 (three) times daily as needed for muscle spasms. Patient not taking: Reported on 02/28/2024 01/27/24 01/26/25  Vann, Jessica U, DO  ciprofloxacin  (CIPRO ) 500 MG tablet Take 1 tablet (500 mg total) by mouth 2 (two) times  daily. Patient not taking: Reported on 02/12/2024 01/27/24   Vann, Jessica U, DO  metroNIDAZOLE  (FLAGYL ) 500 MG tablet Take 1 tablet (500 mg total) by mouth every 12 (twelve) hours. Patient not taking: Reported on 02/12/2024 01/27/24   Vann, Jessica U, DO  traMADol  (ULTRAM ) 50 MG tablet Take 50 mg  by mouth every 6 (six) hours as needed for moderate pain (pain score 4-6) or severe pain (pain score 7-10). Patient not taking: Reported on 02/28/2024 01/21/24   [provider]     Positive ROS: Denies fever, chills, or sweats.  Denies increased knee pain.  All other systems have been reviewed and were otherwise negative with the exception of those mentioned in the HPI and as above. Recent Results (from the past 2160 hours)  POC occult blood, ED Provider will collect     Status: Abnormal   Collection Time: 01/25/24  8:58 AM  Result Value Ref Range   Fecal Occult Bld POSITIVE (A) NEGATIVE  Comprehensive metabolic panel     Status: Abnormal   Collection Time: 01/25/24  9:08 AM  Result Value Ref Range   Sodium 135 135 - 145 mmol/L   Potassium 4.0 3.5 - 5.1 mmol/L   Chloride 108 98 - 111 mmol/L   CO2 22 22 - 32 mmol/L   Glucose, Bld 110 (H) 70 - 99 mg/dL    Comment: Glucose reference range applies only to samples taken after fasting for at least 8 hours.   BUN 25 (H) 8 - 23 mg/dL   Creatinine, Ser 4.09 0.44 - 1.00 mg/dL   Calcium 8.2 (L) 8.9 - 10.3 mg/dL   Total Protein 5.7 (L) 6.5 - 8.1 g/dL   Albumin 3.1 (L) 3.5 - 5.0 g/dL   AST 22 15 - 41 U/L   ALT 32 0 - 44 U/L   Alkaline Phosphatase 38 38 - 126 U/L   Total Bilirubin 0.4 0.0 - 1.2 mg/dL   GFR, Estimated >81 >19 mL/min    Comment: (NOTE) Calculated using the CKD-EPI Creatinine Equation (2021)    Anion gap 5 5 - 15    Comment: Performed at [facility] [address] [place], [place] [address]  CBC with Differential     Status: Abnormal   Collection Time: 01/25/24  9:08 AM  Result Value Ref Range   WBC 9.2 4.0  - 10.5 K/uL   RBC 3.37 (L) 3.87 - 5.11 MIL/uL   Hemoglobin 10.0 (L) 12.0 - 15.0 g/dL   HCT 95.6 (L) 21.3 - 08.6 %   MCV 96.1 80.0 - 100.0 fL   MCH 29.7 26.0 - 34.0 pg   MCHC 30.9 30.0 - 36.0 g/dL   RDW 57.8 46.9 - 62.9 %   Platelets 420 (H) 150 - 400 K/uL   nRBC 0.0 0.0 - 0.2 %   Neutrophils Relative % 75 %   Neutro Abs 6.9 1.7 - 7.7 K/uL   Lymphocytes Relative 14 %   Lymphs Abs 1.3 0.7 - 4.0 K/uL   Monocytes Relative 10 %   Monocytes Absolute 0.9 0.1 - 1.0 K/uL   Eosinophils Relative 0 %   Eosinophils Absolute 0.0 0.0 - 0.5 K/uL   Basophils Relative 0 %   Basophils Absolute 0.0 0.0 - 0.1 K/uL   Immature Granulocytes 1 %   Abs Immature Granulocytes 0.05 0.00 - 0.07 K/uL    Comment: Performed at [facility], [address], [place], [place] [address]  Protime-INR     Status: None   Collection Time: 01/25/24  9:08 AM  Result Value Ref Range   Prothrombin Time 14.0 11.4 - 15.2 seconds   INR 1.1 0.8 - 1.2    Comment: (NOTE) INR goal varies based on device and disease states. Performed at [facility], [address]  [place], [place] [address]   Type and screen [facility]     Status: None   Collection Time: 01/25/24  9:08 AM  Result Value Ref Range   ABO/RH(D) A NEG    Antibody Screen NEG    Sample Expiration      01/28/2024,2359 Performed at [facility], [address], [place], [place] [address]   MRSA Next Gen by PCR, Nasal     Status: None   Collection Time: 01/25/24  3:20 PM   Specimen: Nasal Mucosa; Nasal Swab  Result Value Ref Range   MRSA by PCR Next Gen NOT DETECTED NOT DETECTED    Comment: (NOTE) The GeneXpert MRSA Assay (FDA approved for NASAL specimens only), is one component of a comprehensive MRSA colonization surveillance program. It is not intended to diagnose MRSA infection nor to guide or monitor treatment for MRSA infections. Test performance is not FDA approved in  patients less than 47 years old. Performed at [facility], [address], [place], [place] [address]   ABO/Rh     Status: None   Collection Time: 01/25/24  3:56 PM  Result Value Ref Range   ABO/RH(D)      A NEG Performed at [facility], [address] [place], [place] [address]   HIV Antibody (routine testing w rflx)     Status: None   Collection Time: 01/26/24  2:53 AM  Result Value Ref Range   HIV Screen 4th Generation wRfx Non Reactive Non Reactive    Comment: Performed at [facility], [address], [place], [place] [address]  CBC     Status: Abnormal   Collection Time: 01/26/24  2:53 AM  Result Value Ref Range   WBC 6.8 4.0 - 10.5 K/uL   RBC 3.23 (L) 3.87 - 5.11 MIL/uL   Hemoglobin 9.8 (L) 12.0 - 15.0 g/dL   HCT 13.0 (L) 86.5 - 78.4 %   MCV 98.5 80.0 - 100.0 fL   MCH 30.3 26.0 - 34.0 pg   MCHC 30.8 30.0 - 36.0 g/dL   RDW 69.6 29.5 - 28.4 %   Platelets 406 (H) 150 - 400 K/uL   nRBC 0.0 0.0 - 0.2 %    Comment: Performed at [facility], [address] [place], [place] [address]  Comprehensive metabolic panel     Status: Abnormal   Collection Time: 01/26/24  2:53 AM  Result Value Ref Range   Sodium 136 135 - 145 mmol/L   Potassium 4.2 3.5 - 5.1 mmol/L   Chloride 107 98 - 111 mmol/L   CO2 21 (L) 22 - 32 mmol/L   Glucose, Bld 89 70 - 99 mg/dL    Comment: Glucose reference range applies only to samples taken after fasting for at least 8 hours.   BUN 21 8 - 23 mg/dL   Creatinine, Ser 0.10 0.44 - 1.00 mg/dL   Calcium 8.4 (L) 8.9 - 10.3 mg/dL   Total Protein 5.9 (L) 6.5 - 8.1 g/dL   Albumin 3.1 (L) 3.5 - 5.0 g/dL   AST 21 15 - 41 U/L   ALT 31 0 - 44 U/L   Alkaline Phosphatase 40 38 - 126 U/L   Total Bilirubin 0.7 0.0 - 1.2 mg/dL   GFR, Estimated >27 >25 mL/min    Comment: (NOTE) Calculated using the CKD-EPI Creatinine Equation (2021)    Anion gap 8 5 - 15    Comment: Performed at [facility], [address], [place], [place] [address]  CBC     Status: Abnormal   Collection Time: 01/27/24  4:27 AM  Result Value Ref Range   WBC 7.1 4.0 - 10.5 K/uL   RBC 3.15 (L) 3.87 - 5.11 MIL/uL   Hemoglobin 9.5 (L) 12.0 - 15.0 g/dL   HCT 19.1 (L) 47.8 - 29.5 %   MCV 95.6 80.0 - 100.0 fL   MCH 30.2 26.0 - 34.0 pg   MCHC 31.6 30.0 - 36.0 g/dL   RDW 62.1 30.8 - 65.7 %   Platelets 420 (H) 150 - 400 K/uL   nRBC 0.0 0.0 - 0.2 %    Comment: Performed at [facility], [address] [place], [place] [address]  Basic metabolic panel     Status: Abnormal   Collection Time: 01/27/24  4:27 AM  Result Value Ref Range   Sodium 136 135 - 145 mmol/L   Potassium 3.8 3.5 - 5.1 mmol/L   Chloride 104 98 - 111 mmol/L   CO2 24 22 - 32 mmol/L   Glucose, Bld 97 70 - 99 mg/dL    Comment: Glucose reference range applies only to samples taken after fasting for at least 8 hours.   BUN 18 8 - 23 mg/dL   Creatinine, Ser 2.95 0.44 - 1.00 mg/dL   Calcium 8.8 (L) 8.9 - 10.3 mg/dL   GFR, Estimated >28 >41 mL/min    Comment: (NOTE) Calculated using the CKD-EPI Creatinine Equation (2021)    Anion gap 8 5 - 15    Comment: Performed at [facility], [address] [place], [place] [address]  Iron, TIBC and Ferritin Panel     Status: None   Collection Time: 02/12/24  2:35 PM  Result Value Ref Range   Iron 56 45 - 160 mcg/dL   TIBC 102 725 - 366 mcg/dL (calc)   %SAT 16 16 - 45 % (calc)   Ferritin 105 16 - 288 ng/mL  CBC with Differential/Platelet     Status: Abnormal   Collection Time: 02/12/24  2:35 PM  Result Value Ref Range   WBC 4.5 4.0 - 10.5 K/uL   RBC 3.77 (L) 3.87 - 5.11 Mil/uL   Hemoglobin 11.9 (L) 12.0 - 15.0 g/dL   HCT 44.0 (L) 34.7 - 42.5 %   MCV 95.1 78.0 - 100.0 fl   MCHC 33.1 30.0 - 36.0 g/dL   RDW 95.6 38.7 - 56.4 %   Platelets 551.0 (H) 150.0 - 400.0 K/uL   Neutrophils Relative % 54.0 43.0 - 77.0 %   Lymphocytes Relative 30.0 12.0  - 46.0 %   Monocytes Relative 15.0 (H) 3.0 - 12.0 %   Eosinophils Relative 0.0 0.0 - 5.0 %   Basophils Relative 1.0 0.0 - 3.0 %   Neutro Abs 2.4 1.4 - 7.7 K/uL   Lymphs Abs 1.4 0.7 - 4.0 K/uL   Monocytes Absolute 0.7 0.1 - 1.0 K/uL   Eosinophils Absolute 0.0 0.0 - 0.7 K/uL   Basophils Absolute 0.0 0.0 - 0.1 K/uL  Comprehensive metabolic panel     Status: Abnormal   Collection Time: 02/28/24 11:33 AM  Result Value Ref Range   Sodium 134 (L) 135 - 145 mmol/L   Potassium 4.1 3.5 - 5.1 mmol/L   Chloride 104 98 - 111 mmol/L   CO2 23 22 - 32 mmol/L   Glucose, Bld 103 (H) 70 - 99 mg/dL    Comment: Glucose  reference range applies only to samples taken after fasting for at least 8 hours.   BUN 18 8 - 23 mg/dL   Creatinine, Ser 7.82 0.44 - 1.00 mg/dL   Calcium 9.0 8.9 - 95.6 mg/dL   Total Protein 7.3 6.5 - 8.1 g/dL   Albumin 4.0 3.5 - 5.0 g/dL   AST 17 15 - 41 U/L   ALT 13 0 - 44 U/L   Alkaline Phosphatase 51 38 - 126 U/L   Total Bilirubin 0.4 0.0 - 1.2 mg/dL   GFR, Estimated >21 >30 mL/min    Comment: (NOTE) Calculated using the CKD-EPI Creatinine Equation (2021)    Anion gap 7 5 - 15    Comment: Performed at [facility], [address] [place], [place] [address]  CBC with Differential     Status: Abnormal   Collection Time: 02/28/24 11:33 AM  Result Value Ref Range   WBC 8.8 4.0 - 10.5 K/uL   RBC 3.97 3.87 - 5.11 MIL/uL   Hemoglobin 12.2 12.0 - 15.0 g/dL   HCT 46.9 62.9 - 52.8 %   MCV 96.5 80.0 - 100.0 fL   MCH 30.7 26.0 - 34.0 pg   MCHC 31.9 30.0 - 36.0 g/dL   RDW 41.3 24.4 - 01.0 %   Platelets 405 (H) 150 - 400 K/uL   nRBC 0.0 0.0 - 0.2 %   Neutrophils Relative % 75 %   Neutro Abs 6.6 1.7 - 7.7 K/uL   Lymphocytes Relative 13 %   Lymphs Abs 1.1 0.7 - 4.0 K/uL   Monocytes Relative 11 %   Monocytes Absolute 1.0 0.1 - 1.0 K/uL   Eosinophils Relative 0 %   Eosinophils Absolute 0.0 0.0 - 0.5 K/uL   Basophils Relative 1 %   Basophils Absolute 0.1 0.0 -  0.1 K/uL   Immature Granulocytes 0 %   Abs Immature Granulocytes 0.01 0.00 - 0.07 K/uL    Comment: Performed at [facility], [address] [place], [place] [address]  Sedimentation rate     Status: Abnormal   Collection Time: 02/28/24 12:05 PM  Result Value Ref Range   Sed Rate 36 (H) 0 - 22 mm/hr    Comment: Performed at [facility], [address] [place], [place] [address]    Physical Exam: Vitals:   02/28/24 1044 02/28/24 1235  BP: (!) 104/91 116/67  Pulse: (!) 111 84  Resp: 19 20  Temp: 98.9 F (37.2 C) 98.3 F (36.8 C)  SpO2: 98% 95%    General: Alert, no acute distress Cardiovascular: No pedal edema Respiratory: No cyanosis, no use of accessory musculature GI: No organomegaly, abdomen is soft and non-tender Skin: No lesions in the area of chief complaint Neurologic: Sensation intact distally Psychiatric: Patient is competent for consent with normal mood and affect Lymphatic: No axillary or cervical lymphadenopathy  MUSCULOSKELETAL: Right knee exam: She has some erythema around the inferior portion of her scar.  She has a tiny pinhole that is draining a small amount of serous fluid may be a little pus.  I used a Q-tip and was only able to get about three fourths of an inch under the skin in all directions.  There did not appear to be any communication deep within the joint through the joint capsule.  Her knee range of motion is 0 degrees to 100 degrees of flexion without pain.  Assessment/Plan: Right knee joint infection 6 weeks status post right TKA.  Plan: Admit to [facility] inpatient for administration of IV antibiotics.  Infectious disease consult for recommendations regarding IV antibiotics and management. Today we will aspirate the right knee joint and send the fluid for synovial fluid analysis, cell count, crystals, Gram stain, and cultures.  Procedure: After obtaining informed verbal consent from the patient  we painted the superior lateral patellar region of the right knee with Betadine and allowed it to dry completely.  I then aspirated 25 cc of nonpurulent amber-colored fluid from the right knee joint.  There was no evidence of pus.  I then instilled 45 cc of sterile saline into the right knee joint and move the joint back-and-forth.  I did not see any communication from the joint deeply through the superficial small area of drainage. The synovial fluid that was withdrawn was sent to the lab for analysis, Gram stain, and cultures.  Hopefully, we can avoid surgical intervention.  We feel that most likely this is a superficial infection.  We appreciate infectious disease assistance in this case.  The case was discussed at length with Dr. Murrell Arrant and a treatment plan was formulated. We hope to avoid surgery.  We will allow her to continue on a regular diet and we will reevaluate her Saturday morning.  Continue on IV antibiotics. Will await further laboratory data from her knee aspiration.  Doris Garnet, PA-C [phone] JohnL. Murrell Arrant, MD [phone]

## 2024-02-29 DIAGNOSIS — N764 Abscess of vulva: Secondary | ICD-10-CM

## 2024-02-29 DIAGNOSIS — B9689 Other specified bacterial agents as the cause of diseases classified elsewhere: Secondary | ICD-10-CM | POA: Diagnosis not present

## 2024-02-29 DIAGNOSIS — M009 Pyogenic arthritis, unspecified: Secondary | ICD-10-CM

## 2024-02-29 LAB — CBC WITH DIFFERENTIAL/PLATELET
Abs Immature Granulocytes: 0.01 10*3/uL (ref 0.00–0.07)
Basophils Absolute: 0 10*3/uL (ref 0.0–0.1)
Basophils Relative: 1 %
Eosinophils Absolute: 0 10*3/uL (ref 0.0–0.5)
Eosinophils Relative: 0 %
HCT: 37.6 % (ref 36.0–46.0)
Hemoglobin: 11.6 g/dL — ABNORMAL LOW (ref 12.0–15.0)
Immature Granulocytes: 0 %
Lymphocytes Relative: 22 %
Lymphs Abs: 1.3 10*3/uL (ref 0.7–4.0)
MCH: 31.3 pg (ref 26.0–34.0)
MCHC: 30.9 g/dL (ref 30.0–36.0)
MCV: 101.3 fL — ABNORMAL HIGH (ref 80.0–100.0)
Monocytes Absolute: 1 10*3/uL (ref 0.1–1.0)
Monocytes Relative: 16 %
Neutro Abs: 3.7 10*3/uL (ref 1.7–7.7)
Neutrophils Relative %: 61 %
Platelets: 358 10*3/uL (ref 150–400)
RBC: 3.71 MIL/uL — ABNORMAL LOW (ref 3.87–5.11)
RDW: 13.4 % (ref 11.5–15.5)
WBC: 6.1 10*3/uL (ref 4.0–10.5)
nRBC: 0 % (ref 0.0–0.2)

## 2024-02-29 NOTE — Plan of Care (Signed)

## 2024-02-29 NOTE — Progress Notes (Signed)
 [facility] Infectious Diseases Follow Up Note  Patient Identification: Patient Name: Alyssa [name] MRN: [id number] Admit Date: 02/28/2024 10:41 AM Age: 68 y.o.Today's Date: 02/29/2024   Reason for Visit: Right TKA cellulitis  Principal Problem:   Joint infection [facility]) Active Problems:   S/P total knee arthroplasty, right   Infection of right knee (HCC)  Antibiotics: Vancomycin, linezolid, cefepime 5/9 Linezolid 5/9   Lines/Hardwares:  Interval Events: Remains afebrile  Assessment 68 year old female with prior history of breast cancer, obesity, history of right TKA 3/26 admitted with concern of acute redness on the right TKA surgical site in the setting of left labial carbuncle 1 week ago  Closely followed by orthopedics 5/9 SP aspiration of right knee-no crystals, WBC 680, neutrophils 22%, lymphocytes 70%, Gram stain no organisms, Cx NGTD  5/9 wound cx GPC in clusters  Left labial carbuncle - clinically improving   Recommendations - Continue p.o. linezolid as is  - Synovial fluid from right knee with cell count not impressive for infection ( was on antibiotics prior)  - Fu wound and rt knee cultures  - Monitor CBC and BMP - Follow-up on orthopedic recs Universal/standard isolation precautions Dr Zelda Hickman here starting Monday  Rest of the management as per the primary team. Thank you for the consult. Please page with pertinent questions or concerns.  ______________________________________________________________________ Subjective patient seen and examined at the bedside.  Husband at bedside.  Reports she had her knee aspirated by Ortho yesterday.  Redness and swelling in her right knee has significantly improved but feels stiff.  Reports her left labial carbuncle has also decreased in size, no drainage or redness.  Denies fevers, chills or nausea or vomiting  Past Medical History:  Diagnosis Date   Arthritis     fingers,back,knees   Cancer (HCC)    breast,nipple removed form left breast,right breast cancer   Cataract, immature    bilateral   Dental bridge present    upper and lower   Dental crowns present    6-7   Diverticulosis 2013   Severe Diverticulosis by colonoscopy    History of breast cancer    right   History of lumpectomy of right breast 2002   Microcalcifications of the breast 10/2014   left   Obesity    Radiculopathy, lumbar region    Past Surgical History:  Procedure Laterality Date   BRCA inconclusive     BREAST BIOPSY Left 11/18/2014   Procedure: LEFT NIPPLE REMOVAL;  Surgeon: Adalberto Hollow, MD;  Location: [facility];  Service: General;  Laterality: Left;   BREAST LUMPECTOMY Right    CARPAL TUNNEL RELEASE Right 05/27/2008   cataracts Bilateral    COLONOSCOPY     COLONOSCOPY WITH PROPOFOL   05/01/2012   DILATION AND CURETTAGE OF UTERUS     KNEE ARTHROSCOPY     x 3 - 2 on left, 1 on right   left meniscus tear repair     left nipple removal      LYMPH NODE DISSECTION     right carpal tunnel release     right meniscus tear repair     right shoulder surgery     bone chip removal, arthroscopic    SHOULDER ARTHROSCOPY Right 05/27/2008   TONSILLECTOMY     TUBAL LIGATION     Vitals BP 122/63 (BP Location: Right Arm)   Pulse 83   Temp 98.1 F (36.7 C) (Oral)   Resp 16   Ht 5\' 4"  (1.626 m)  Wt 79.4 kg   SpO2 97%   BMI 30.04 kg/m     Physical Exam Constitutional: Adult female sitting in the recliner, not in acute distress and comfortable    Comments: HEENT WNL  Cardiovascular:     Rate and Rhythm: Normal rate and regular rhythm.     Heart sounds:   S1 and S2  Pulmonary:     Effort: Pulmonary effort is normal.     Comments: Normal breath sounds  Abdominal:     Palpations: Abdomen is soft.     Tenderness: Nondistended and nontender  Musculoskeletal:        General: No swelling or tenderness in other peripheral joints  Right knee  surgical site with some redness with a central opening in the surgical site, no active drainage but overlying bandage with soft bloody drainage.  Mild restriction of range of motion of the right knee   Chaperoned  Left labial swelling, approxi-1 cm size with central opening, no active drainage or fluctuance or crepitus, improving per patient report  Skin:    Comments: No rashes  Neurological:     General: Awake, alert and oriented, grossly nonfocal  Psychiatric:        Mood and Affect: Mood normal.    Pertinent Microbiology Results for orders placed or performed during the hospital encounter of 02/28/24  Aerobic Culture w Gram Stain (superficial specimen)     Status: None (Preliminary result)   Collection Time: 02/28/24 12:43 PM   Specimen: Wound  Result Value Ref Range Status   Specimen Description   Final    WOUND Performed at [facility], [address], [place], [place] [address]    Special Requests   Final    NONE Performed at [facility], [address], [place], [place] [address]    Gram Stain   Final    MODERATE WBC PRESENT, PREDOMINANTLY PMN RARE GRAM POSITIVE COCCI IN CLUSTERS Performed at [facility], [address] [place], [place] [address]    Culture PENDING  Incomplete   Report Status PENDING  Incomplete  Body fluid culture w Gram Stain     Status: None (Preliminary result)   Collection Time: 02/28/24  2:45 PM   Specimen: KNEE; Body Fluid  Result Value Ref Range Status   Specimen Description   Final    KNEE RGHT Performed at [facility], [address] [place], [place] [address]    Special Requests   Final    NONE Performed at [facility], [address], [place], [place] [address]    Gram Stain   Final    NO WBC SEEN NO ORGANISMS SEEN Performed at [facility], [address] [place], [place] [address]    Culture PENDING  Incomplete   Report Status  PENDING  Incomplete   Pertinent Lab.    Latest Ref Rng & Units 02/28/2024   11:33 AM 02/12/2024    2:35 PM 01/27/2024    4:27 AM  CBC  WBC 4.0 - 10.5 K/uL 8.8  4.5  7.1   Hemoglobin 12.0 - 15.0 g/dL 10.2  72.5  9.5   Hematocrit 36.0 - 46.0 % 38.3  35.8  30.1   Platelets 150 - 400 K/uL 405  551.0  420       Latest Ref Rng & Units 02/28/2024   11:33 AM 01/27/2024    4:27 AM 01/26/2024    2:53 AM  CMP  Glucose 70 - 99 mg/dL 161  97  89   BUN 8 - 23 mg/dL 18  18  21    Creatinine 0.44 - 1.00 mg/dL 0.96  0.45  4.09   Sodium 135 - 145 mmol/L 134  136  136   Potassium 3.5 - 5.1 mmol/L 4.1  3.8  4.2   Chloride 98 - 111 mmol/L 104  104  107   CO2 22 - 32 mmol/L 23  24  21    Calcium 8.9 - 10.3 mg/dL 9.0  8.8  8.4   Total Protein 6.5 - 8.1 g/dL 7.3   5.9   Total Bilirubin 0.0 - 1.2 mg/dL 0.4   0.7   Alkaline Phos 38 - 126 U/L 51   40   AST 15 - 41 U/L 17   21   ALT 0 - 44 U/L 13   31      Pertinent Imaging today Plain films and CT images have been personally visualized and interpreted; radiology reports have been reviewed. Decision making incorporated into the Impression /   No results found.  I have personally spent 51 minutes involved in face-to-face and non-face-to-face activities for this patient on the day of the visit. Professional time spent includes the following activities: Preparing to see the patient (review of tests), Obtaining and/or reviewing separately obtained history (admission/discharge record), Performing a medically appropriate examination and/or evaluation , Ordering medications/tests/procedures, referring and communicating with other health care professionals, Documenting clinical information in the EMR, Independently interpreting results (not separately reported), Communicating results to the patient/family/caregiver, Counseling and educating the patient/family/caregiver and Care coordination (not separately reported).   Plan d/w requesting provider as well as ID pharm  D  Of note, portions of this note may have been created with voice recognition software. While this note has been edited for accuracy, occasional wrong-word or 'sound-a-like' substitutions may have occurred due to the inherent limitations of voice recognition software.   Electronically signed by:   Terre Ferri, MD Infectious Disease Physician [facility] Pager: [phone]

## 2024-02-29 NOTE — Progress Notes (Addendum)
 Subjective: The patient reports no increased knee pain.  Denies fever, chills, or sweats.  She is able to walk in the room without difficulty. ID saw her earlier.  Objective: Vital signs in last 24 hours: Temp:  [98.1 F (36.7 C)-98.9 F (37.2 C)] 98.1 F (36.7 C) (05/10 0513) Pulse Rate:  [83-111] 83 (05/10 0513) Resp:  [16-20] 16 (05/10 0513) BP: (104-122)/(63-91) 122/63 (05/10 0513) SpO2:  [95 %-99 %] 97 % (05/10 0513) Weight:  [79.4 kg] 79.4 kg (05/09 1045)  Intake/Output from previous day: 05/09 0701 - 05/10 0700 In: 600 [P.O.:600] Out: -  Intake/Output this shift: Total I/O In: 240 [P.O.:240] Out: -   Recent Labs    02/28/24 1133 02/29/24 0926  HGB 12.2 11.6*   Recent Labs    02/28/24 1133 02/29/24 0926  WBC 8.8 6.1  RBC 3.97 3.71*  HCT 38.3 37.6  PLT 405* 358   Recent Labs    02/28/24 1133  NA 134*  K 4.1  CL 104  CO2 23  BUN 18  CREATININE 0.84  GLUCOSE 103*  CALCIUM 9.0  Synovial fluid Gram stain from yesterday showed no organisms and no WBCs. Wound culture from the emergency room shows rare gram-positive cocci in clusters.  Physical exam: Less redness around the wound.  Range of motion 0 degrees to 100 degrees without significant pain.  I was able to express a small amount of pus from the tiny opening at the inferior wound.  No significant joint effusion.    Assessment/Plan: Wound infection right knee 6 weeks status post right TKA. Plan: We appreciate infectious disease assistance in this case. At this point we feel this is a superficial wound infection and not deep in the joint.  Continue antibiotics per ID.  Need to keep her in the hospital for another day or so just for careful monitoring.  Do not think surgical problem at this point unless she takes a turn for the worse.  Case discussed in detail with Dr. Murrell Arrant.  Awaiting final cultures.   Autumn Boast 02/29/2024, 10:05 AM

## 2024-03-01 LAB — AEROBIC CULTURE W GRAM STAIN (SUPERFICIAL SPECIMEN)

## 2024-03-01 MED ORDER — LINEZOLID 600 MG PO TABS: 600.0000 mg | ORAL_TABLET | Freq: Two times a day (BID) | ORAL | 0 refills | Status: DC

## 2024-03-01 NOTE — Discharge Instructions (Signed)
 Keep incision clean and dry. Use waterproof Band-Aid over small open area to shower. Change the right knee dressing daily.

## 2024-03-01 NOTE — Progress Notes (Signed)
 Subjective: The patient is doing well.  She is walking in the hall.  Denies fever, chills, or sweats.  No increased knee pain.  Has not noticed any drainage from her knee.  Objective: Vital signs in last 24 hours: Temp:  [97.8 F (36.6 C)-98.4 F (36.9 C)] 97.8 F (36.6 C) (05/11 0539) Pulse Rate:  [84-92] 88 (05/11 0539) Resp:  [16-17] 17 (05/11 0539) BP: (105-131)/(72-83) 105/83 (05/11 0539) SpO2:  [97 %-100 %] 97 % (05/11 0539)  Intake/Output from previous day: 05/10 0701 - 05/11 0700 In: 960 [P.O.:960] Out: -  Intake/Output this shift: Total I/O In: 360 [P.O.:360] Out: -   Recent Labs    02/28/24 1133 02/29/24 0926  HGB 12.2 11.6*   Recent Labs    02/28/24 1133 02/29/24 0926  WBC 8.8 6.1  RBC 3.97 3.71*  HCT 38.3 37.6  PLT 405* 358   Recent Labs    02/28/24 1133  NA 134*  K 4.1  CL 104  CO2 23  BUN 18  CREATININE 0.84  GLUCOSE 103*  CALCIUM 9.0  Superficial wound cultures grew MRSA sensitive to Avelox Intra-articular aspiration cultures are negative. Right knee exam Minimal redness.  No active drainage today.  Range of motion 0 degrees to 105 degrees.  Calf is soft and nontender.  Overall the wound looks much better.    Assessment/Plan: Wound infection right knee with MRSA status post right TKA 6 weeks ago. Plan: Right knee dressing is changed today.  I did use a Q-tip and swab out the area. No additional purulence was encountered. I spoke with the infectious disease physician and she recommended a 2-week course of Linezolid. Patient will do daily dressing changes and keep the incision dry.  We will see her back in Dr. Murrell Arrant office in 4 days.   Autumn Boast 03/01/2024, 12:38 PM

## 2024-03-01 NOTE — Discharge Summary (Signed)
 Patient ID: Alyssa [name] MRN: [id number] DOB/AGE: 68-19-1957 68 y.o.  Admit date: 02/28/2024 Discharge date: 03/01/2024  Admission Diagnoses:  Principal Problem:   Joint infection (HCC) Active Problems:   S/P total knee arthroplasty, right   Infection of right knee [facility])   Discharge Diagnoses:  Same  Past Medical History:  Diagnosis Date   Arthritis    fingers,back,knees   Cancer (HCC)    breast,nipple removed form left breast,right breast cancer   Cataract, immature    bilateral   Dental bridge present    upper and lower   Dental crowns present    6-7   Diverticulosis 2013   Severe Diverticulosis by colonoscopy    History of breast cancer    right   History of lumpectomy of right breast 2002   Microcalcifications of the breast 10/2014   left   Obesity    Radiculopathy, lumbar region     Surgeries: None   Consultants: Infectious disease  Discharged Condition: Improved  Hospital Course: Alyssa [name] is an 68 y.o. female who was admitted 02/28/2024 for operative treatment ofJoint infection (HCC).  The patient had drainage and redness around her total knee wound that was 6 months postop.  She came to the hospital where superficial wound cultures grew MRSA..  Aspiration from deep within the prosthetic knee joint were negative.  Infectious disease was consulted and sensitivities to MRSA were from [place].  Their help was greatly appreciated.  No surgical intervention was needed during this hospitalization.  At the time of the discharge patient was afebrile her knee redness had dramatically improved and she had minimal if any drainage from her tiny incision opening.  She was bending her knee fully without difficulty.  She was ambulating in the hall without difficulty.  Patient was given perioperative antibiotics:  Anti-infectives (From admission, onward)    Start     Dose/Rate Route Frequency Ordered Stop   03/01/24 0000  linezolid (ZYVOX) 600 MG tablet        600  mg Oral Every 12 hours 03/01/24 1249     02/28/24 2200  linezolid (ZYVOX) tablet 600 mg        600 mg Oral Every 12 hours 02/28/24 1352     02/28/24 1230  vancomycin (VANCOREADY) IVPB 1750 mg/350 mL  Status:  Discontinued        1,750 mg 175 mL/hr over 120 Minutes Intravenous  Once 02/28/24 1157 02/28/24 1352   02/28/24 1200  ceFEPIme (MAXIPIME) 2 g in sodium chloride  0.9 % 100 mL IVPB        2 g 200 mL/hr over 30 Minutes Intravenous  Once 02/28/24 1157 02/28/24 1302        Patient was given sequential compression devices, early ambulation, and chemoprophylaxis to prevent DVT.  Patient benefited maximally from hospital stay and there were no complications.    Recent vital signs: Patient Vitals for the past 24 hrs:  BP Temp Temp src Pulse Resp SpO2  03/01/24 0539 105/83 97.8 F (36.6 C) Oral 88 17 97 %  02/29/24 2109 131/82 98 F (36.7 C) Oral 84 16 100 %  02/29/24 1314 119/72 98.4 F (36.9 C) Oral 92 -- 98 %     Recent laboratory studies:  Recent Labs    02/28/24 1133 02/29/24 0926  WBC 8.8 6.1  HGB 12.2 11.6*  HCT 38.3 37.6  PLT 405* 358  NA 134*  --   K 4.1  --   CL 104  --  CO2 23  --   BUN 18  --   CREATININE 0.84  --   GLUCOSE 103*  --   CALCIUM 9.0  --      Discharge Medications:   Allergies as of 03/01/2024   No Known Allergies      Medication List     STOP taking these medications    aspirin EC 81 MG tablet   Baclofen  5 MG Tabs   cefadroxil 500 MG capsule Commonly known as: DURICEF   ciprofloxacin  500 MG tablet Commonly known as: CIPRO    metroNIDAZOLE  500 MG tablet Commonly known as: FLAGYL    sulfamethoxazole-trimethoprim 800-160 MG tablet Commonly known as: BACTRIM DS       TAKE these medications    acetaminophen  500 MG tablet Commonly known as: TYLENOL  Take 1,000 mg by mouth every 6 (six) hours as needed for mild pain (pain score 1-3) or moderate pain (pain score 4-6).   Align 10 MG Caps Take 1 capsule by mouth daily at 12  noon.   CALCIUM + D PO Take 1 tablet by mouth daily.   Colace 100 MG capsule Generic drug: docusate sodium Take 100 mg by mouth 2 (two) times daily.   linezolid 600 MG tablet Commonly known as: ZYVOX Take 1 tablet (600 mg total) by mouth every 12 (twelve) hours.   Multivitamin Adult Tabs Take 1 tablet by mouth daily.   ondansetron  4 MG tablet Commonly known as: ZOFRAN  Take 4 mg by mouth every 6 (six) hours as needed.   traMADol  50 MG tablet Commonly known as: ULTRAM  Take 50 mg by mouth every 6 (six) hours as needed for moderate pain (pain score 4-6) or severe pain (pain score 7-10).        Diagnostic Studies: No results found. Recent Results (from the past 2160 hours)  POC occult blood, ED Provider will collect     Status: Abnormal   Collection Time: 01/25/24  8:58 AM  Result Value Ref Range   Fecal Occult Bld POSITIVE (A) NEGATIVE  Comprehensive metabolic panel     Status: Abnormal   Collection Time: 01/25/24  9:08 AM  Result Value Ref Range   Sodium 135 135 - 145 mmol/L   Potassium 4.0 3.5 - 5.1 mmol/L   Chloride 108 98 - 111 mmol/L   CO2 22 22 - 32 mmol/L   Glucose, Bld 110 (H) 70 - 99 mg/dL    Comment: Glucose reference range applies only to samples taken after fasting for at least 8 hours.   BUN 25 (H) 8 - 23 mg/dL   Creatinine, Ser 1.61 0.44 - 1.00 mg/dL   Calcium 8.2 (L) 8.9 - 10.3 mg/dL   Total Protein 5.7 (L) 6.5 - 8.1 g/dL   Albumin 3.1 (L) 3.5 - 5.0 g/dL   AST 22 15 - 41 U/L   ALT 32 0 - 44 U/L   Alkaline Phosphatase 38 38 - 126 U/L   Total Bilirubin 0.4 0.0 - 1.2 mg/dL   GFR, Estimated >09 >60 mL/min    Comment: (NOTE) Calculated using the CKD-EPI Creatinine Equation (2021)    Anion gap 5 5 - 15    Comment: Performed at [facility], [address], [place], [place] [address]  CBC with Differential     Status: Abnormal   Collection Time: 01/25/24  9:08 AM  Result Value Ref Range   WBC 9.2 4.0 - 10.5 K/uL   RBC 3.37 (L) 3.87  - 5.11 MIL/uL   Hemoglobin  10.0 (L) 12.0 - 15.0 g/dL   HCT 16.1 (L) 09.6 - 04.5 %   MCV 96.1 80.0 - 100.0 fL   MCH 29.7 26.0 - 34.0 pg   MCHC 30.9 30.0 - 36.0 g/dL   RDW 40.9 81.1 - 91.4 %   Platelets 420 (H) 150 - 400 K/uL   nRBC 0.0 0.0 - 0.2 %   Neutrophils Relative % 75 %   Neutro Abs 6.9 1.7 - 7.7 K/uL   Lymphocytes Relative 14 %   Lymphs Abs 1.3 0.7 - 4.0 K/uL   Monocytes Relative 10 %   Monocytes Absolute 0.9 0.1 - 1.0 K/uL   Eosinophils Relative 0 %   Eosinophils Absolute 0.0 0.0 - 0.5 K/uL   Basophils Relative 0 %   Basophils Absolute 0.0 0.0 - 0.1 K/uL   Immature Granulocytes 1 %   Abs Immature Granulocytes 0.05 0.00 - 0.07 K/uL    Comment: Performed at [facility], [address] [place], [place] [address]  Protime-INR     Status: None   Collection Time: 01/25/24  9:08 AM  Result Value Ref Range   Prothrombin Time 14.0 11.4 - 15.2 seconds   INR 1.1 0.8 - 1.2    Comment: (NOTE) INR goal varies based on device and disease states. Performed at [facility], [address] [place], [place] [address]   Type and screen [facility]     Status: None   Collection Time: 01/25/24  9:08 AM  Result Value Ref Range   ABO/RH(D) A NEG    Antibody Screen NEG    Sample Expiration      01/28/2024,2359 Performed at [facility], [address] [place], [place] [address]   MRSA Next Gen by PCR, Nasal     Status: None   Collection Time: 01/25/24  3:20 PM   Specimen: Nasal Mucosa; Nasal Swab  Result Value Ref Range   MRSA by PCR Next Gen NOT DETECTED NOT DETECTED    Comment: (NOTE) The GeneXpert MRSA Assay (FDA approved for NASAL specimens only), is one component of a comprehensive MRSA colonization surveillance program. It is not intended to diagnose MRSA infection nor to guide or monitor treatment for MRSA infections. Test performance is not FDA approved in patients less than 19  years old. Performed at [facility], [address] [place], [place] [address]   ABO/Rh     Status: None   Collection Time: 01/25/24  3:56 PM  Result Value Ref Range   ABO/RH(D)      A NEG Performed at [facility], [address] [place], [place] [address]   HIV Antibody (routine testing w rflx)     Status: None   Collection Time: 01/26/24  2:53 AM  Result Value Ref Range   HIV Screen 4th Generation wRfx Non Reactive Non Reactive    Comment: Performed at [facility], [address] [place], [place] [address]  CBC     Status: Abnormal   Collection Time: 01/26/24  2:53 AM  Result Value Ref Range   WBC 6.8 4.0 - 10.5 K/uL   RBC 3.23 (L) 3.87 - 5.11 MIL/uL   Hemoglobin 9.8 (L) 12.0 - 15.0 g/dL   HCT 01.0 (L) 27.2 - 53.6 %   MCV 98.5 80.0 - 100.0 fL   MCH 30.3 26.0 - 34.0 pg   MCHC 30.8 30.0 - 36.0 g/dL   RDW 64.4 03.4 -  15.5 %   Platelets 406 (H) 150 - 400 K/uL   nRBC 0.0 0.0 - 0.2 %    Comment: Performed at [facility], [address], [place], [place] [address]  Comprehensive metabolic panel     Status: Abnormal   Collection Time: 01/26/24  2:53 AM  Result Value Ref Range   Sodium 136 135 - 145 mmol/L   Potassium 4.2 3.5 - 5.1 mmol/L   Chloride 107 98 - 111 mmol/L   CO2 21 (L) 22 - 32 mmol/L   Glucose, Bld 89 70 - 99 mg/dL    Comment: Glucose reference range applies only to samples taken after fasting for at least 8 hours.   BUN 21 8 - 23 mg/dL   Creatinine, Ser 4.78 0.44 - 1.00 mg/dL   Calcium 8.4 (L) 8.9 - 10.3 mg/dL   Total Protein 5.9 (L) 6.5 - 8.1 g/dL   Albumin 3.1 (L) 3.5 - 5.0 g/dL   AST 21 15 - 41 U/L   ALT 31 0 - 44 U/L   Alkaline Phosphatase 40 38 - 126 U/L   Total Bilirubin 0.7 0.0 - 1.2 mg/dL   GFR, Estimated >29 >56 mL/min    Comment: (NOTE) Calculated using the CKD-EPI Creatinine Equation (2021)    Anion gap 8 5 - 15    Comment: Performed at [facility], [address], [place], [place] [address]  CBC     Status: Abnormal   Collection Time: 01/27/24  4:27 AM  Result Value Ref Range   WBC 7.1 4.0 - 10.5 K/uL   RBC 3.15 (L) 3.87 - 5.11 MIL/uL   Hemoglobin 9.5 (L) 12.0 - 15.0 g/dL   HCT 65.7 (L) 84.6 - 96.2 %   MCV 95.6 80.0 - 100.0 fL   MCH 30.2 26.0 - 34.0 pg   MCHC 31.6 30.0 - 36.0 g/dL   RDW 95.2 84.1 - 32.4 %   Platelets 420 (H) 150 - 400 K/uL   nRBC 0.0 0.0 - 0.2 %    Comment: Performed at [facility] [address] [place], [place] [address]  Basic metabolic panel     Status: Abnormal   Collection Time: 01/27/24  4:27 AM  Result Value Ref Range   Sodium 136 135 - 145 mmol/L   Potassium 3.8 3.5 - 5.1 mmol/L   Chloride 104 98 - 111 mmol/L   CO2 24 22 - 32 mmol/L   Glucose, Bld 97 70 - 99 mg/dL    Comment: Glucose reference range applies only to samples taken after fasting for at least 8 hours.   BUN 18 8 - 23 mg/dL   Creatinine, Ser 7.25 0.44 - 1.00 mg/dL   Calcium 8.8 (L) 8.9 - 10.3 mg/dL   GFR, Estimated >36 >64 mL/min    Comment: (NOTE) Calculated using the CKD-EPI Creatinine Equation (2021)    Anion gap 8 5 - 15    Comment: Performed at [facility], [address], [place], [place] [address]  Iron, TIBC and Ferritin Panel     Status: None   Collection Time: 02/12/24  2:35 PM  Result Value Ref Range   Iron 56 45 - 160 mcg/dL   TIBC 425 956 - 387 mcg/dL (calc)   %SAT 16 16 - 45 % (calc)   Ferritin 105 16 - 288 ng/mL  CBC with Differential/Platelet     Status: Abnormal   Collection Time: 02/12/24  2:35 PM  Result  Value Ref Range   WBC 4.5 4.0 - 10.5 K/uL   RBC 3.77 (L) 3.87 - 5.11 Mil/uL   Hemoglobin 11.9 (L) 12.0 - 15.0 g/dL   HCT 40.1 (L) 02.7 - 25.3 %   MCV 95.1 78.0 - 100.0 fl   MCHC 33.1 30.0 - 36.0 g/dL   RDW 66.4 40.3 - 47.4 %   Platelets 551.0 (H) 150.0 - 400.0 K/uL   Neutrophils Relative % 54.0 43.0 - 77.0 %   Lymphocytes Relative 30.0 12.0 - 46.0 %   Monocytes  Relative 15.0 (H) 3.0 - 12.0 %   Eosinophils Relative 0.0 0.0 - 5.0 %   Basophils Relative 1.0 0.0 - 3.0 %   Neutro Abs 2.4 1.4 - 7.7 K/uL   Lymphs Abs 1.4 0.7 - 4.0 K/uL   Monocytes Absolute 0.7 0.1 - 1.0 K/uL   Eosinophils Absolute 0.0 0.0 - 0.7 K/uL   Basophils Absolute 0.0 0.0 - 0.1 K/uL  Comprehensive metabolic panel     Status: Abnormal   Collection Time: 02/28/24 11:33 AM  Result Value Ref Range   Sodium 134 (L) 135 - 145 mmol/L   Potassium 4.1 3.5 - 5.1 mmol/L   Chloride 104 98 - 111 mmol/L   CO2 23 22 - 32 mmol/L   Glucose, Bld 103 (H) 70 - 99 mg/dL    Comment: Glucose reference range applies only to samples taken after fasting for at least 8 hours.   BUN 18 8 - 23 mg/dL   Creatinine, Ser 2.59 0.44 - 1.00 mg/dL   Calcium 9.0 8.9 - 56.3 mg/dL   Total Protein 7.3 6.5 - 8.1 g/dL   Albumin 4.0 3.5 - 5.0 g/dL   AST 17 15 - 41 U/L   ALT 13 0 - 44 U/L   Alkaline Phosphatase 51 38 - 126 U/L   Total Bilirubin 0.4 0.0 - 1.2 mg/dL   GFR, Estimated >87 >56 mL/min    Comment: (NOTE) Calculated using the CKD-EPI Creatinine Equation (2021)    Anion gap 7 5 - 15    Comment: Performed at [facility], [address] [place], [place] [address]  CBC with Differential     Status: Abnormal   Collection Time: 02/28/24 11:33 AM  Result Value Ref Range   WBC 8.8 4.0 - 10.5 K/uL   RBC 3.97 3.87 - 5.11 MIL/uL   Hemoglobin 12.2 12.0 - 15.0 g/dL   HCT 51.8 84.1 - 66.0 %   MCV 96.5 80.0 - 100.0 fL   MCH 30.7 26.0 - 34.0 pg   MCHC 31.9 30.0 - 36.0 g/dL   RDW 63.0 16.0 - 10.9 %   Platelets 405 (H) 150 - 400 K/uL   nRBC 0.0 0.0 - 0.2 %   Neutrophils Relative % 75 %   Neutro Abs 6.6 1.7 - 7.7 K/uL   Lymphocytes Relative 13 %   Lymphs Abs 1.1 0.7 - 4.0 K/uL   Monocytes Relative 11 %   Monocytes Absolute 1.0 0.1 - 1.0 K/uL   Eosinophils Relative 0 %   Eosinophils Absolute 0.0 0.0 - 0.5 K/uL   Basophils Relative 1 %   Basophils Absolute 0.1 0.0 - 0.1 K/uL   Immature  Granulocytes 0 %   Abs Immature Granulocytes 0.01 0.00 - 0.07 K/uL    Comment: Performed at [facility], [address] [place], [place] [address]  Sedimentation rate     Status: Abnormal   Collection Time: 02/28/24 12:05 PM  Result  Value Ref Range   Sed Rate 36 (H) 0 - 22 mm/hr    Comment: Performed at [facility], [address], [place], [place] [address]  C-reactive protein     Status: Abnormal   Collection Time: 02/28/24 12:05 PM  Result Value Ref Range   CRP 6.6 (H) <1.0 mg/dL    Comment: Performed at [facility], [address], [place], [place] [address]  Culture, blood (Routine X 2) w Reflex to ID Panel     Status: None (Preliminary result)   Collection Time: 02/28/24 12:23 PM   Specimen: BLOOD  Result Value Ref Range   Specimen Description      BLOOD LEFT ANTECUBITAL Performed at [facility], [address] [place], [place] [address]    Special Requests      BOTTLES DRAWN AEROBIC AND ANAEROBIC Blood Culture adequate volume Performed at [facility], [address], [place], [place] [address]    Culture      NO GROWTH 2 DAYS Performed at [facility], [address] [place], [place] [address]    Report Status PENDING   Culture, blood (Routine X 2) w Reflex to ID Panel     Status: None (Preliminary result)   Collection Time: 02/28/24 12:36 PM   Specimen: BLOOD  Result Value Ref Range   Specimen Description      BLOOD RIGHT ANTECUBITAL Performed at [facility], [address] [place], [place] [address]    Special Requests      BOTTLES DRAWN AEROBIC AND ANAEROBIC Blood Culture results may not be optimal due to an inadequate volume of blood received in culture bottles Performed at [facility], [address], [place], [place] [address]    Culture      NO GROWTH 2 DAYS Performed at [facility], [address] [place],  [place] [address]    Report Status PENDING   Aerobic Culture w Gram Stain (superficial specimen)     Status: None   Collection Time: 02/28/24 12:43 PM   Specimen: Wound  Result Value Ref Range   Specimen Description      WOUND Performed at [facility], [address], [place], [place] [address]    Special Requests      NONE Performed at [facility], [address], [place], [place] [address]    Gram Stain      MODERATE WBC PRESENT, PREDOMINANTLY PMN RARE GRAM POSITIVE COCCI IN CLUSTERS Performed at [facility], [address], [place], [place] [address]    Culture RARE METHICILLIN RESISTANT STAPHYLOCOCCUS AUREUS    Report Status 03/01/2024 FINAL    Organism ID, Bacteria METHICILLIN RESISTANT STAPHYLOCOCCUS AUREUS       Susceptibility   Methicillin resistant staphylococcus aureus - MIC*    CIPROFLOXACIN  >=8 RESISTANT Resistant     ERYTHROMYCIN >=8 RESISTANT Resistant     GENTAMICIN <=0.5 SENSITIVE Sensitive     OXACILLIN >=4 RESISTANT Resistant     TETRACYCLINE <=1 SENSITIVE Sensitive     VANCOMYCIN 1 SENSITIVE Sensitive     TRIMETH/SULFA >=320 RESISTANT Resistant     CLINDAMYCIN <=0.25 SENSITIVE Sensitive     RIFAMPIN <=0.5 SENSITIVE Sensitive     Inducible Clindamycin NEGATIVE Sensitive     LINEZOLID 2 SENSITIVE Sensitive     * RARE METHICILLIN RESISTANT STAPHYLOCOCCUS AUREUS  Synovial cell count + diff, w/ crystals  Status: Abnormal   Collection Time: 02/28/24  2:44 PM  Result Value Ref Range   Color, Synovial RED (A) YELLOW   Appearance-Synovial TURBID (A) CLEAR   Crystals, Fluid NO CRYSTALS SEEN    WBC, Synovial 680 (H) 0 - 200 /cu mm   Neutrophil, Synovial 22 0 - 25 %   Lymphocytes-Synovial Fld 70 (H) 0 - 20 %   Monocyte-Macrophage-Synovial Fluid 8 (L) 50 - 90 %   Eosinophils-Synovial 0 0 - 1 %   Other Cells-SYN 0     Comment: Performed at [facility], [address], [place], [place] [address]  Body  fluid culture w Gram Stain     Status: None (Preliminary result)   Collection Time: 02/28/24  2:45 PM   Specimen: KNEE; Body Fluid  Result Value Ref Range   Specimen Description      KNEE RGHT Performed at [facility], [address], [place], [place] [address]    Special Requests      NONE Performed at [facility], [address] [place], [place] [address]    Gram Stain NO WBC SEEN NO ORGANISMS SEEN     Culture      NO GROWTH 2 DAYS Performed at [facility], [address], [place], [place] [address]    Report Status PENDING   CBC with Differential/Platelet     Status: Abnormal   Collection Time: 02/29/24  9:26 AM  Result Value Ref Range   WBC 6.1 4.0 - 10.5 K/uL   RBC 3.71 (L) 3.87 - 5.11 MIL/uL   Hemoglobin 11.6 (L) 12.0 - 15.0 g/dL   HCT 84.6 96.2 - 95.2 %   MCV 101.3 (H) 80.0 - 100.0 fL   MCH 31.3 26.0 - 34.0 pg   MCHC 30.9 30.0 - 36.0 g/dL   RDW 84.1 32.4 - 40.1 %   Platelets 358 150 - 400 K/uL   nRBC 0.0 0.0 - 0.2 %   Neutrophils Relative % 61 %   Neutro Abs 3.7 1.7 - 7.7 K/uL   Lymphocytes Relative 22 %   Lymphs Abs 1.3 0.7 - 4.0 K/uL   Monocytes Relative 16 %   Monocytes Absolute 1.0 0.1 - 1.0 K/uL   Eosinophils Relative 0 %   Eosinophils Absolute 0.0 0.0 - 0.5 K/uL   Basophils Relative 1 %   Basophils Absolute 0.0 0.0 - 0.1 K/uL   Immature Granulocytes 0 %   Abs Immature Granulocytes 0.01 0.00 - 0.07 K/uL    Comment: Performed at [facility], [address], [place], [place] [address]     Disposition: Discharge disposition: 01-Home or Self Care     Infectious disease recommended a 2-week course of Linezolid. The patient will notify us  if she has increased drainage, redness, pain, fever, chills, or sweats.  Discharge Instructions     Call MD / Call 911   Complete by: As directed    If you experience chest pain or shortness of breath, CALL 911 and be transported to the hospital  emergency room.  If you develope a fever above 101 F, pus (white drainage) or increased drainage or redness at the wound, or calf pain, call your surgeon's office.   Constipation Prevention   Complete by: As directed    Drink plenty of fluids.  Prune juice may be helpful.  You may use a stool softener, such as Colace (over the counter) 100 mg twice a day.  Use  MiraLax (over the counter) for constipation as needed.   Diet general   Complete by: As directed    Increase activity slowly as tolerated   Complete by: As directed         Follow-up Information     Neil Balls, MD. Schedule an appointment as soon as possible for a visit on 03/05/2024.   Specialty: Orthopedic Surgery Contact information: [address] [place] [address] [phone]                  Signed: Autumn Boast 03/01/2024, 12:50 PM

## 2024-03-01 NOTE — TOC Initial Note (Signed)
 Transition of Care [facility]) - Initial/Assessment Note    Patient Details  Name: Alyssa [name] MRN: [id number] Date of Birth: [birth year]  Transition of [facility]) CM/SW Contact:    Levie Ream, RN Phone Number: 03/01/2024, 2:13 PM  Clinical Narrative:                 Home w/ spouse; has cane; no TOC needs.  Expected Discharge Plan: Home/Self Care Barriers to Discharge: No Barriers Identified   Patient Goals and CMS Choice Patient states their goals for this hospitalization and ongoing recovery are:: home CMS Medicare.gov Compare Post Acute Care list provided to:: Patient   [place] ownership interest in [facility].provided to:: Patient    Expected Discharge Plan and Services   Discharge Planning Services: CM Consult   Living arrangements for the past 2 months: Single Family Home Expected Discharge Date: 03/01/24               DME Arranged: N/A DME Agency: NA       HH Arranged: NA HH Agency: NA        Prior Living Arrangements/Services Living arrangements for the past 2 months: Single Family Home Lives with:: Spouse Patient language and need for interpreter reviewed:: Yes Do you feel safe going back to the place where you live?: Yes      Need for Family Participation in Patient Care: Yes (Comment) Care giver support system in place?: Yes (comment) Current home services: DME (cane) Criminal Activity/Legal Involvement Pertinent to Current Situation/Hospitalization: No - Comment as needed  Activities of Daily Living   ADL Screening (condition at time of admission) Independently performs ADLs?: Yes (appropriate for developmental age) Is the patient deaf or have difficulty hearing?: No Does the patient have difficulty seeing, even when wearing glasses/contacts?: No Does the patient have difficulty concentrating, remembering, or making decisions?: No  Permission Sought/Granted Permission sought to share information with : Case  Manager Permission granted to share information with : Yes, Verbal Permission Granted  Share Information with NAME: Case Manager     Permission granted to share info w Relationship: Cocoa Leick (spouse) [phone]     Emotional Assessment Appearance:: Appears stated age Attitude/Demeanor/Rapport: Gracious Affect (typically observed): Accepting Orientation: : Oriented to Self, Oriented to Place, Oriented to  Time, Oriented to Situation Alcohol / Substance Use: Not Applicable Psych Involvement: No (comment)  Admission diagnosis:  Joint infection (HCC) [M00.9] Postoperative infection, unspecified type, initial encounter [T81.40XA] Infection of right knee [facility]) [M00.9] Patient Active Problem List   Diagnosis Date Noted   Joint infection (HCC) 02/28/2024   Infection of right knee (HCC) 02/28/2024   Lower GI bleed 01/25/2024   Normocytic anemia 01/25/2024   Thrombocytosis 01/25/2024   Hyperglycemia 01/25/2024   Mild protein malnutrition (HCC) 01/25/2024   S/P total knee arthroplasty, right 01/25/2024   Diverticulitis large intestine 01/25/2024   ABLA (acute blood loss anemia) 01/25/2024   Malignant tumor of breast (HCC) 06/01/2022   BPPV (benign paroxysmal positional vertigo) 08/10/2021   Impacted cerumen of left ear 08/10/2021   PCP:  Olin Bertin, MD Pharmacy:   [id number] - [place], [place] - [address] [place] [address] Phone: [phone] Fax: [phone]  [id number] - [place], [place] - [address] [place] [address] Phone: [phone] Fax: [phone]     Social Drivers of Health (SDOH) Social History: SDOH Screenings  Food Insecurity: No Food Insecurity (03/01/2024)  Housing: Low Risk  (03/01/2024)  Transportation Needs: No Transportation Needs (03/01/2024)  Utilities: Not At Risk (03/01/2024)  Social Connections: Moderately Integrated  (02/28/2024)  Tobacco Use: Low Risk  (02/28/2024)   SDOH Interventions: Food Insecurity Interventions: Intervention Not Indicated, Inpatient TOC Housing Interventions: Intervention Not Indicated, Inpatient TOC Transportation Interventions: Intervention Not Indicated, Inpatient TOC Utilities Interventions: Intervention Not Indicated, Inpatient TOC   Readmission Risk Interventions    01/27/2024   11:59 AM  Readmission Risk Prevention Plan  Post Dischage Appt Complete  Medication Screening Complete  Transportation Screening Complete

## 2024-03-01 NOTE — Progress Notes (Signed)
 Pt AVS reviewed and pt verbalized understanding of all DC teaching and instructions. Pt has all of their belongings in their posession.Husband is here for transportation home.

## 2024-03-03 LAB — BODY FLUID CULTURE W GRAM STAIN: Gram Stain: NONE SEEN

## 2024-03-04 LAB — CULTURE, BLOOD (ROUTINE X 2)
Culture: NO GROWTH
Culture: NO GROWTH
Special Requests: ADEQUATE

## 2024-03-04 LAB — PATHOLOGIST SMEAR REVIEW

## 2024-03-11 ENCOUNTER — Ambulatory Visit: Admitting: Physician Assistant

## 2024-03-31 ENCOUNTER — Encounter: Admitting: Internal Medicine

## 2024-04-01 ENCOUNTER — Ambulatory Visit: Admitting: Physician Assistant

## 2024-05-03 NOTE — Progress Notes (Unsigned)
 Ellouise Console, PA-C [address] [place], [place]  [address] Phone: [phone]   Primary Care Physician: Verena Mems, MD  Primary Gastroenterologist:  Ellouise Console, PA-C / Norleen Kiang, MD   Chief Complaint: Follow-up diverticular bleed,, diverticulitis, anemia, constipation.     HPI:   Alyssa [name] is a 68 y.o. female, whom I last saw 02/12/2024, returns for 58-month follow-up of lower GI diverticular bleed, uncomplicated descending Colon Diverticulitis, acute blood loss anemia, and constipation.  She was started on MiraLAX  daily.  Hospitalized April 2025 for diverticular bleed and diverticulitis.  Treated with Cipro  and Flagyl  for uncomplicated descending colon diverticulitis.  Hemoglobin 9.5 during hospitalization.  Symptoms resolved post treatment.  Labs 02/12/2024 showed improved Hgb 11.9, normal iron studies with iron 56, saturation 16%, ferritin 105.  Unfortunately, since I saw her last, she was hospitalized 02/28/2024 for right knee infection which grew out MRSA and was treated with vancomycin . 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  Followed by [facility].  Has follow-up with Dr. Yvone tomorrow.  Current symptoms:   She has not had any more constipation or rectal bleeding in the past 3 months.  She is having recurrent LLQ abdominal pain for 1 month.  It is a dull constant ache.  Not currently on Miralax  (not needed).  She is taking align probiotic once daily with benefit.  She is having normal regular bowel movement once daily.  Thank she is having mild nausea, but no vomiting.  No fever.   05/2022 last colonoscopy by Dr. Kiang: Many diverticula throughout the entire colon.  Small internal hemorrhoids.  No polyps.  Excellent prep.  10-year repeat.   04/2012 screening colonoscopy by Dr. Kiang: Severe diverticulosis, otherwise normal, no polyps.  10-year repeat.  Current Outpatient Medications  Medication Sig Dispense Refill   acetaminophen  (TYLENOL )  500 MG tablet Take 1,000 mg by mouth every 6 (six) hours as needed for mild pain (pain score 1-3) or moderate pain (pain score 4-6).     Calcium Citrate-Vitamin D (CALCIUM + D PO) Take 1 tablet by mouth daily.     Multiple Vitamin (MULTIVITAMIN ADULT) TABS Take 1 tablet by mouth daily.     Probiotic Product (ALIGN) 10 MG CAPS Take 1 capsule by mouth daily at 12 noon.     No current facility-administered medications for this visit.    Allergies as of 05/04/2024   (No Known Allergies)    Past Medical History:  Diagnosis Date   Arthritis    fingers,back,knees   Cancer (HCC)    breast,nipple removed form left breast,right breast cancer   Cataract, immature    bilateral   Dental bridge present    upper and lower   Dental crowns present    6-7   Diverticulosis 2013   Severe Diverticulosis by colonoscopy    History of breast cancer    right   History of lumpectomy of right breast 2002   History of MRSA infection    Microcalcifications of the breast 10/2014   left   Obesity    Radiculopathy, lumbar region     Past Surgical History:  Procedure Laterality Date   BRCA inconclusive     BREAST BIOPSY Left 11/18/2014   Procedure: LEFT NIPPLE REMOVAL;  Surgeon: Krystal Russell, MD;  Location: [facility];  Service: General;  Laterality: Left;   BREAST LUMPECTOMY Right    CARPAL TUNNEL RELEASE Right 05/27/2008   cataracts Bilateral  COLONOSCOPY     COLONOSCOPY WITH PROPOFOL   05/01/2012   DILATION AND CURETTAGE OF UTERUS     KNEE ARTHROSCOPY     x 3 - 2 on left, 1 on right   left meniscus tear repair     left nipple removal      LYMPH NODE DISSECTION     right carpal tunnel release     right meniscus tear repair     right shoulder surgery     bone chip removal, arthroscopic    SHOULDER ARTHROSCOPY Right 05/27/2008   TONSILLECTOMY     TUBAL LIGATION      Review of Systems:    All systems reviewed and negative except where noted in HPI.    Physical Exam:   BP 100/70   Pulse 64   Ht 5' 4 (1.626 m)   Wt 186 lb (84.4 kg)   SpO2 96%   BMI 31.93 kg/m  No LMP recorded. Patient is postmenopausal.  General: Well-nourished, well-developed in no acute distress.  Lungs: Clear to auscultation bilaterally. Non-labored. Heart: Regular rate and rhythm, no murmurs rubs or gallops.  Abdomen: Bowel sounds are normal; Abdomen is Soft; No hepatosplenomegaly, masses or hernias;  Mild LLQ Abdominal Tenderness; Rest of abdomen is not tender.  No guarding or rebound tenderness. Neuro: Alert and oriented x 3.  Grossly intact.  Psych: Alert and cooperative, normal mood and affect.   Imaging Studies: CT angio abdomen pelvis February 13, 2024: 1. Acute diverticulitis of the distal descending colon in the upper pelvis. No associated abscess or intraperitoneal free air. Due to focal colonic thickening secondary to presumed diverticulitis, follow-up colonoscopy would be indicated once diverticulitis is resolved to exclude underlying lesion. 2. No active gastrointestinal bleeding identified by CTA. 3. Cholelithiasis without evidence of acute cholecystitis. 4. 3.2 x 2.8 x 3.5 cm cyst of the left adnexa. Recommend follow-up US  in 6-12 months.   Labs: CBC    Component Value Date/Time   WBC 6.1 02/29/2024 0926   RBC 3.71 (L) 02/29/2024 0926   HGB 11.6 (L) 02/29/2024 0926   HGB 13.4 06/01/2011 1419   HCT 37.6 02/29/2024 0926   HCT 39.8 06/01/2011 1419   PLT 358 02/29/2024 0926   PLT 379 06/01/2011 1419   MCV 101.3 (H) 02/29/2024 0926   MCV 93.7 06/01/2011 1419   MCH 31.3 02/29/2024 0926   MCHC 30.9 02/29/2024 0926   RDW 13.4 02/29/2024 0926   RDW 13.7 06/01/2011 1419   LYMPHSABS 1.3 02/29/2024 0926   LYMPHSABS 2.4 06/01/2011 1419   MONOABS 1.0 02/29/2024 0926   MONOABS 0.5 06/01/2011 1419   EOSABS 0.0 02/29/2024 0926   EOSABS 0.1 06/01/2011 1419   BASOSABS 0.0 02/29/2024 0926   BASOSABS 0.0 06/01/2011 1419    CMP     Component Value Date/Time   NA 134 (L)  02/28/2024 1133   NA 140 04/27/2021 0825   K 4.1 02/28/2024 1133   CL 104 02/28/2024 1133   CO2 23 02/28/2024 1133   GLUCOSE 103 (H) 02/28/2024 1133   BUN 18 02/28/2024 1133   BUN 13 04/27/2021 0825   CREATININE 0.84 02/28/2024 1133   CALCIUM 9.0 02/28/2024 1133   PROT 7.3 02/28/2024 1133   ALBUMIN 4.0 02/28/2024 1133   AST 17 02/28/2024 1133   ALT 13 02/28/2024 1133   ALKPHOS 51 02/28/2024 1133   BILITOT 0.4 02/28/2024 1133   GFRNONAA >60 02/28/2024 1133   GFRAA 88 (L) 11/16/2014 0915    Assessment and Plan:  Demira Gwynne is a 68 y.o. y/o female returns for 43-month follow-up of lower GI bleed from diverticular bleed, diverticulitis, and acute blood loss anemia.  Hemoglobin is improved and stable.  She is having recurrent LLQ pain for 1 month.  I am concerned about possible recurrent diverticulitis.  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  Recently treated for MRSA with vancomycin .  Has follow-up with orthopedic tomorrow.  1.  Uncomplicated descending colon diverticulitis treated with Cipro  and Flagyl  01/2024.   - Order repeat abdominal pelvic CT with contrast to ensure resolution given her current LLQ pain and tenderness.  2.  Recurrent LLQ Pain with LLQ Tenderness - Order Repeat Abd / Pelvic CT with contrast: Evaluate for recurrent Diverticulitis.  3.  Anemia: Improved. - Lab: CBC, iron, ferritin, B12 - Not currently on iron or B12  4.  History of lower GI bleed, probable diverticular bleed 01/2024.  Resolved.  No episodes of rectal bleeding in the past 3 months.  No recent episodes of constipation.  5.  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 Followed by Ortho Dr. Yvone. - Postpone Colonoscopy until lknee infection has completely resolved. - She had a negative colonoscopy 2 years ago 05/2022 by Dr. Abran.  I do not believe a repeat colonoscopy is necessary.  Ellouise Console, PA-C  Follow up 2 months with TG.

## 2024-05-04 ENCOUNTER — Other Ambulatory Visit (INDEPENDENT_AMBULATORY_CARE_PROVIDER_SITE_OTHER)

## 2024-05-04 ENCOUNTER — Encounter: Payer: Self-pay | Admitting: Physician Assistant

## 2024-05-04 ENCOUNTER — Ambulatory Visit (INDEPENDENT_AMBULATORY_CARE_PROVIDER_SITE_OTHER): Admitting: Physician Assistant

## 2024-05-04 VITALS — BP 100/70 | HR 64 | Ht 64.0 in | Wt 186.0 lb

## 2024-05-04 DIAGNOSIS — R1032 Left lower quadrant pain: Secondary | ICD-10-CM | POA: Diagnosis not present

## 2024-05-04 DIAGNOSIS — R10814 Left lower quadrant abdominal tenderness: Secondary | ICD-10-CM | POA: Diagnosis not present

## 2024-05-04 DIAGNOSIS — Z8719 Personal history of other diseases of the digestive system: Secondary | ICD-10-CM

## 2024-05-04 DIAGNOSIS — T8149XA Infection following a procedure, other surgical site, initial encounter: Secondary | ICD-10-CM

## 2024-05-04 DIAGNOSIS — K5732 Diverticulitis of large intestine without perforation or abscess without bleeding: Secondary | ICD-10-CM

## 2024-05-04 DIAGNOSIS — B9562 Methicillin resistant Staphylococcus aureus infection as the cause of diseases classified elsewhere: Secondary | ICD-10-CM

## 2024-05-04 DIAGNOSIS — D649 Anemia, unspecified: Secondary | ICD-10-CM

## 2024-05-04 LAB — CBC WITH DIFFERENTIAL/PLATELET
Basophils Absolute: 0.1 K/uL (ref 0.0–0.1)
Basophils Relative: 0.8 % (ref 0.0–3.0)
Eosinophils Absolute: 0 K/uL (ref 0.0–0.7)
Eosinophils Relative: 0 % (ref 0.0–5.0)
HCT: 37 % (ref 36.0–46.0)
Hemoglobin: 12.2 g/dL (ref 12.0–15.0)
Lymphocytes Relative: 30.8 % (ref 12.0–46.0)
Lymphs Abs: 1.8 K/uL (ref 0.7–4.0)
MCHC: 33 g/dL (ref 30.0–36.0)
MCV: 90.1 fl (ref 78.0–100.0)
Monocytes Absolute: 0.6 K/uL (ref 0.1–1.0)
Monocytes Relative: 10.6 % (ref 3.0–12.0)
Neutro Abs: 3.5 K/uL (ref 1.4–7.7)
Neutrophils Relative %: 57.8 % (ref 43.0–77.0)
Platelets: 358 K/uL (ref 150.0–400.0)
RBC: 4.1 Mil/uL (ref 3.87–5.11)
RDW: 13.8 % (ref 11.5–15.5)
WBC: 6 K/uL (ref 4.0–10.5)

## 2024-05-04 LAB — B12 AND FOLATE PANEL
Folate: >23.4 ng/mL (ref >5.9)
Vitamin B-12: 176 pg/mL — ABNORMAL LOW (ref 211–911)

## 2024-05-04 NOTE — Progress Notes (Signed)
 Noted

## 2024-05-04 NOTE — Patient Instructions (Addendum)
 Your provider has requested that you go to the basement level for lab work before leaving today. Press B on the elevator. The lab is located at the first door on the left as you exit the elevator.  You have been scheduled for a CT scan of the abdomen and pelvis at [facility], 1st floor Radiology. You are scheduled on 05/08/24 at 3:00 pm for a 5:00 pm scan. You should arrive 15 minutes prior to your appointment time for registration.   You may take any medications as prescribed with a small amount of water, if necessary. If you take any of the following medications: METFORMIN, GLUCOPHAGE, GLUCOVANCE, AVANDAMET, RIOMET, FORTAMET, ACTOPLUS MET, JANUMET, GLUMETZA or METAGLIP, you MAY be asked to HOLD this medication 48 hours AFTER the exam.   If you have any questions regarding your exam or if you need to reschedule, you may call [facility] at [phone] between the hours of 8:00 am and 5:00 pm, Monday-Friday.   Please follow up sooner if symptoms increase or worsen  Due to recent changes in healthcare laws, you may see the results of your imaging and laboratory studies on MyChart before your provider has had a chance to review them.  We understand that in some cases there may be results that are confusing or concerning to you. Not all laboratory results come back in the same time frame and the provider may be waiting for multiple results in order to interpret others.  Please give us  48 hours in order for your provider to thoroughly review all the results before contacting the office for clarification of your results.   Thank you for trusting me with your gastrointestinal care!   Ellouise Console, PA-C _______________________________________________________  If your blood pressure at your visit was 140/90 or greater, please contact your primary care physician to follow up on this.  _______________________________________________________  If you are age 6 or older, your body  mass index should be between 23-30. Your Body mass index is 31.93 kg/m. If this is out of the aforementioned range listed, please consider follow up with your Primary Care Provider.  If you are age 7 or younger, your body mass index should be between 19-25. Your Body mass index is 31.93 kg/m. If this is out of the aformentioned range listed, please consider follow up with your Primary Care Provider.   ________________________________________________________  The [facility] providers would like to encourage you to use MYCHART to communicate with providers for non-urgent requests or questions.  Due to long hold times on the telephone, sending your provider a message by [facility] may be a faster and more efficient way to get a response.  Please allow 48 business hours for a response.  Please remember that this is for non-urgent requests.  _______________________________________________________

## 2024-05-05 ENCOUNTER — Ambulatory Visit: Payer: Self-pay | Admitting: Physician Assistant

## 2024-05-05 LAB — IRON,TIBC AND FERRITIN PANEL
%SAT: 28 % (ref 16–45)
Ferritin: 21 ng/mL (ref 16–288)
Iron: 90 ug/dL (ref 45–160)
TIBC: 325 ug/dL (ref 250–450)

## 2024-05-08 ENCOUNTER — Ambulatory Visit (HOSPITAL_COMMUNITY)

## 2024-05-11 ENCOUNTER — Ambulatory Visit (HOSPITAL_COMMUNITY)
Admission: RE | Admit: 2024-05-11 | Discharge: 2024-05-11 | Disposition: A | Discharge status: Home / Self Care | Source: Ambulatory Visit | Attending: Physician Assistant | Admitting: Physician Assistant

## 2024-05-11 DIAGNOSIS — Z8719 Personal history of other diseases of the digestive system: Secondary | ICD-10-CM | POA: Insufficient documentation

## 2024-05-11 DIAGNOSIS — R1032 Left lower quadrant pain: Secondary | ICD-10-CM | POA: Insufficient documentation

## 2024-05-11 MED ORDER — IOHEXOL 300 MG/ML  SOLN
100.0000 mL | Freq: Once | INTRAMUSCULAR | Status: AC | PRN
  Administered 2024-05-11: 100 mL via INTRAVENOUS

## 2024-06-29 ENCOUNTER — Ambulatory Visit: Admitting: Physician Assistant

## 2024-08-27 ENCOUNTER — Ambulatory Visit: Admitting: Physician Assistant

## 2024-08-27 ENCOUNTER — Encounter: Payer: Self-pay | Admitting: Physician Assistant

## 2024-08-27 VITALS — BP 112/66 | HR 90 | Ht 64.0 in | Wt 182.0 lb

## 2024-08-27 DIAGNOSIS — R109 Unspecified abdominal pain: Secondary | ICD-10-CM | POA: Diagnosis not present

## 2024-08-27 DIAGNOSIS — Z8719 Personal history of other diseases of the digestive system: Secondary | ICD-10-CM

## 2024-08-27 DIAGNOSIS — R1032 Left lower quadrant pain: Secondary | ICD-10-CM

## 2024-08-27 DIAGNOSIS — R197 Diarrhea, unspecified: Secondary | ICD-10-CM

## 2024-08-27 MED ORDER — DICYCLOMINE HCL 10 MG PO CAPS: 10.0000 mg | ORAL_CAPSULE | Freq: Three times a day (TID) | ORAL | 2 refills | Status: AC

## 2024-08-27 NOTE — Progress Notes (Signed)
 Noted

## 2024-08-27 NOTE — Progress Notes (Signed)
 Alyssa Console, PA-C [address] [place], [place]  [address] Phone: [phone]   Gastroenterology Consultation  Referring Provider:     Verena Mems, MD Primary Care Physician:  Verena Mems, MD Primary Gastroenterologist:  Alyssa Console, PA-C / Norleen Kiang, MD  Reason for Consultation:     Follow-up diverticular bleed, diverticulitis, anemia.  Now having loose stools.        HPI:   Discussed the use of AI scribe software for clinical note transcription with the patient, who gave verbal consent to proceed.  Brief history: Hospitalized April 2025 for diverticular bleed and uncomplicated diverticulitis, treated with Cipro  and Flagyl .  Hemoglobin 9.5 during hospitalization.  Symptoms resolved post treatment.  Labs 02/12/2024 showed improved Hgb 11.9, normal iron studies with iron 56, saturation 16%, ferritin 105. She was hospitalized 02/28/2024 for right knee infection which grew out MRSA and was treated with vancomycin . 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  Followed by [facility] by Dr. Yvone.  I last saw patient 05/04/2024.   - 05/12/2024 repeat abdominal pelvic CT to follow-up with diverticulitis showed: Diverticulosis, but no diverticulitis.  Cholelithiasis without cholecystitis.  3.6 cm left ovarian cyst.   -05/04/2024 anemia labs showed: Improved normal hemoglobin 12.2, normal iron panel and ferritin, normal folate, low vitamin B12 (176).  She was started on OTC vitamin B12, take 1000 mg daily.  05/2022 last colonoscopy by Dr. Kiang: Many diverticula throughout the entire colon.  Small internal hemorrhoids.  No polyps.  Excellent prep.  10-year repeat.   04/2012 screening colonoscopy by Dr. Kiang: Severe diverticulosis, otherwise normal, no polyps.  10-year repeat.  History of Present Illness Alyssa [name] is a 68 year old female who presents with gastrointestinal issues following knee surgery.  She has been experiencing persistent gastrointestinal  issues since her knee surgery, with a significant gastrointestinal bleed occurring approximately ten days post-surgery. Initially, she had constipation managed with MiraLAX , and probiotic Align.  Over the past 3 to 4 months she developed loose stools.  She discontinued MiraLAX .  She continues to have 3-5 loose stools daily.  The stools are loose, soft and pasty, accompanied by burning and itching sensations, and she experiences a strong urge to defecate, especially when a bathroom is not accessible.  She still has discomfort in the left side of her abdomen. No nausea is present.  She underwent an MRI to follow-up with left ovarian cyst, and her GYN (Dr. Evangeline) reassured her it was benign.  She denies hematochezia, except occasionally mild bright red blood on tissue due to rectal irritation. She has not experienced nausea or skipped days without a bowel movement. Her bowel movements are sometimes formed, as was the case this morning, but typically she has multiple soft stools daily. She has not taken any medications for diarrhea or constipation, such as Miralax  or Imodium.  She was on a significant amount of antibiotics for her knee infection and diverticulitis in the past 6 months.  She was advised her knee infection resolved.  She recently stopped taking the Align probiotic she increased her fiber intake.     Past Medical History:  Diagnosis Date   Arthritis    fingers,back,knees   Cancer (HCC)    breast,nipple removed form left breast,right breast cancer   Cataract, immature    bilateral   Dental bridge present    upper and lower   Dental crowns present    6-7   Diverticulosis 2013   Severe Diverticulosis  by colonoscopy    History of breast cancer    right   History of lumpectomy of right breast 2002   History of MRSA infection    Microcalcifications of the breast 10/2014   left   Obesity    Radiculopathy, lumbar region     Past Surgical History:  Procedure Laterality Date    BRCA inconclusive     BREAST BIOPSY Left 11/18/2014   Procedure: LEFT NIPPLE REMOVAL;  Surgeon: Krystal Russell, MD;  Location: [facility];  Service: General;  Laterality: Left;   BREAST LUMPECTOMY Right    CARPAL TUNNEL RELEASE Right 05/27/2008   cataracts Bilateral    COLONOSCOPY     COLONOSCOPY WITH PROPOFOL   05/01/2012   DILATION AND CURETTAGE OF UTERUS     KNEE ARTHROSCOPY     x 3 - 2 on left, 1 on right   left meniscus tear repair     left nipple removal      LYMPH NODE DISSECTION     right carpal tunnel release     right meniscus tear repair     right shoulder surgery     bone chip removal, arthroscopic    SHOULDER ARTHROSCOPY Right 05/27/2008   TONSILLECTOMY     TUBAL LIGATION      Prior to Admission medications   Medication Sig Start Date End Date Taking? Authorizing Provider  acetaminophen  (TYLENOL ) 500 MG tablet Take 1,000 mg by mouth every 6 (six) hours as needed for mild pain (pain score 1-3) or moderate pain (pain score 4-6).    [provider]  Calcium Citrate-Vitamin D (CALCIUM + D PO) Take 1 tablet by mouth daily.    [provider]  Multiple Vitamin (MULTIVITAMIN ADULT) TABS Take 1 tablet by mouth daily.    [provider]  Probiotic Product (ALIGN) 10 MG CAPS Take 1 capsule by mouth daily at 12 noon.    [provider]    Family History  Problem Relation Age of Onset   Colon polyps Father    Heart disease Father    Colon cancer Neg Hx    Crohn's disease Neg Hx    Esophageal cancer Neg Hx    Rectal cancer Neg Hx    Stomach cancer Neg Hx    Pancreatic cancer Neg Hx      Social History   Tobacco Use   Smoking status: Never    Passive exposure: Past (as a child mom smoked)   Smokeless tobacco: Never  Vaping Use   Vaping status: Never Used  Substance Use Topics   Alcohol use: Yes    Comment: occasionally   Drug use: No    Allergies as of 08/27/2024   (No Known Allergies)    Review of Systems:     All systems reviewed and negative except where noted in HPI.   Physical Exam:  BP 112/66   Pulse 90   Ht 5' 4 (1.626 m)   Wt 182 lb (82.6 kg)   SpO2 97%   BMI 31.24 kg/m  No LMP recorded. Patient is postmenopausal.  General:   Alert,  Well-developed, well-nourished, pleasant and cooperative in NAD Lungs:  Respirations even and unlabored.  Clear throughout to auscultation.   No wheezes, crackles, or rhonchi. No acute distress. Heart:  Regular rate and rhythm; no murmurs, clicks, rubs, or gallops. Abdomen:  Normal bowel sounds.  No bruits.  Soft, and non-distended without masses, hepatosplenomegaly or hernias noted.  Mild LLQ tenderness.  Rest of abdomen is nontender.  No guarding or rebound tenderness.    Neurologic:  Alert and oriented x3;  grossly normal neurologically. Psych:  Alert and cooperative. Normal mood and affect.   Imaging Studies: No results found.  Labs: CBC    Component Value Date/Time   WBC 6.0 05/04/2024 1011   RBC 4.10 05/04/2024 1011   HGB 12.2 05/04/2024 1011   HGB 13.4 06/01/2011 1419   HCT 37.0 05/04/2024 1011   HCT 39.8 06/01/2011 1419   PLT 358.0 05/04/2024 1011   PLT 379 06/01/2011 1419   MCV 90.1 05/04/2024 1011   MCV 93.7 06/01/2011 1419    CMP     Component Value Date/Time   NA 134 (L) 02/28/2024 1133   NA 140 04/27/2021 0825   K 4.1 02/28/2024 1133   CL 104 02/28/2024 1133   CO2 23 02/28/2024 1133   GLUCOSE 103 (H) 02/28/2024 1133   BUN 18 02/28/2024 1133   BUN 13 04/27/2021 0825   CREATININE 0.84 02/28/2024 1133   CALCIUM 9.0 02/28/2024 1133   PROT 7.3 02/28/2024 1133   ALBUMIN 4.0 02/28/2024 1133   AST 17 02/28/2024 1133   ALT 13 02/28/2024 1133   ALKPHOS 51 02/28/2024 1133   BILITOT 0.4 02/28/2024 1133   GFRNONAA >60 02/28/2024 1133   GFRAA 88 (L) 11/16/2014 0915    Assessment and Plan:   Alyssa [name] is a 68 y.o. y/o female has been referred for  follow-up of lower GI bleed from diverticular bleed,  diverticulitis, and acute blood loss anemia.  Hemoglobin is improved and stable.  She was on multiple antibiotics for diverticulitis and MRSA in the infection the past 6 months.  Now she is having loose stools.  Differential includes C. difficile, microscopic colitis, IBS-D.   1.  Uncomplicated descending colon diverticulitis treated with Cipro  and Flagyl  01/2024: Resolved.  Follow-up repeat CT scan showed no evidence of diverticulitis. - Scheduling Colonoscopy I discussed risks of colonoscopy with patient to include risk of bleeding, colon perforation, and risk of sedation.  Patient expressed understanding and agrees to proceed with colonoscopy.   2.  Lower abdominal cramping - Rx dicyclomine 10 mg 1 tablet 3 times daily as needed, 90, 2 refills.  3.  Diarrhea, Loose Stools - Order C. Diff stool test given her antibiotic use in the past few months.  4.  Cholelithiasis: Asymptomatic - Low-fat diet - Patient education - Surgical referral if she becomes symptomatic with RUQ pain, N/V.  5.  3.6 cm left ovarian cyst: Stable, Benign, Followed by Dr. [facility].   Follow up based on colonoscopy results and GI symptoms.  Alyssa Console, PA-C

## 2024-08-27 NOTE — Patient Instructions (Addendum)
 We have sent the following medications to your pharmacy for you to pick up at your convenience: Dicyclomine 10 mg three times daily as need  Your provider has ordered Diatherix stool testing for you. You have received a kit from our office today containing all necessary supplies to complete this test. Please carefully read the stool collection instructions provided in the kit before opening the accompanying materials. In addition, be sure there is a label providing your full name and date of birth on the puritan opti-swab tube that is supplied in the kit (if you do not see a label with this information on your test tube, please make us  aware before test collection!). After completing the test, you should secure the purtian tube into the specimen biohazard bag. The [facility] E-Req sheet (including date and time of specimen collection) should be placed into the outside pocket of the specimen biohazard bag and returned to the [facility] lab (basement floor of [facility]) within 3 days of collection. Please make sure to give the specimen to a staff member at the lab. DO NOT leave the specimen on the counter.   If the specimen date and time (can be found in the upper right boxed portion of the sheet) are not filled out on the E-Req sheet, the test will NOT be performed.   You have been scheduled for a Colonoscopy. Please follow written instructions given to you at your visit today.   If you use inhalers (even only as needed), please bring them with you on the day of your procedure.  DO NOT TAKE 7 DAYS PRIOR TO TEST- Trulicity (dulaglutide) Ozempic, Wegovy (semaglutide) Mounjaro (tirzepatide) Bydureon Bcise (exanatide extended release)  DO NOT TAKE 1 DAY PRIOR TO YOUR TEST Rybelsus (semaglutide) Adlyxin (lixisenatide) Victoza (liraglutide) Byetta (exanatide) ___________________________________________________________________________  Please follow up sooner if  symptoms increase or worsen   Due to recent changes in healthcare laws, you may see the results of your imaging and laboratory studies on MyChart before your provider has had a chance to review them.  We understand that in some cases there may be results that are confusing or concerning to you. Not all laboratory results come back in the same time frame and the provider may be waiting for multiple results in order to interpret others.  Please give us  48 hours in order for your provider to thoroughly review all the results before contacting the office for clarification of your results.   Thank you for trusting me with your gastrointestinal care!   Ellouise Console, PA-C _______________________________________________________  If your blood pressure at your visit was 140/90 or greater, please contact your primary care physician to follow up on this.  _______________________________________________________  If you are age 65 or older, your body mass index should be between 23-30. Your Body mass index is 31.24 kg/m. If this is out of the aforementioned range listed, please consider follow up with your Primary Care Provider.  If you are age 2 or younger, your body mass index should be between 19-25. Your Body mass index is 31.24 kg/m. If this is out of the aformentioned range listed, please consider follow up with your Primary Care Provider.   ________________________________________________________  The [facility] providers would like to encourage you to use MYCHART to communicate with providers for non-urgent requests or questions.  Due to long hold times on the telephone, sending your provider a message by [facility] may be a faster and more efficient way to get a response.  Please allow 48 business hours for a response.  Please remember that this is for non-urgent requests.  _______________________________________________________

## 2024-09-07 MED ORDER — NA SULFATE-K SULFATE-MG SULF 17.5-3.13-1.6 GM/177ML PO SOLN
ORAL | 0 refills | Status: AC
Start: 2024-09-07 — End: ?

## 2024-09-24 ENCOUNTER — Encounter: Admitting: Internal Medicine

## 2024-11-23 ENCOUNTER — Ambulatory Visit: Admitting: Podiatry

## 2024-11-30 ENCOUNTER — Ambulatory Visit: Admitting: Podiatry

## 2024-12-02 ENCOUNTER — Ambulatory Visit: Admitting: Podiatry

## 2025-04-08 ENCOUNTER — Ambulatory Visit: Admitting: Diagnostic Neuroimaging
